# Patient Record
Sex: Male | Born: 1965 | Race: White | Hispanic: No | Marital: Single | Smoking: Current some day smoker
Health system: Southern US, Community
[De-identification: ages and names within clinical notes are randomized; demographics above are authoritative.]

## PROBLEM LIST (undated history)

## (undated) ENCOUNTER — Emergency Department (HOSPITAL_COMMUNITY): Admission: EM | Payer: Medicare Other | Source: Home / Self Care

## (undated) DIAGNOSIS — J449 Chronic obstructive pulmonary disease, unspecified: Secondary | ICD-10-CM

## (undated) DIAGNOSIS — Z720 Tobacco use: Secondary | ICD-10-CM

## (undated) DIAGNOSIS — R739 Hyperglycemia, unspecified: Secondary | ICD-10-CM

## (undated) DIAGNOSIS — E119 Type 2 diabetes mellitus without complications: Secondary | ICD-10-CM

## (undated) DIAGNOSIS — F32A Depression, unspecified: Secondary | ICD-10-CM

## (undated) DIAGNOSIS — F329 Major depressive disorder, single episode, unspecified: Secondary | ICD-10-CM

## (undated) DIAGNOSIS — G4730 Sleep apnea, unspecified: Secondary | ICD-10-CM

## (undated) HISTORY — PX: PILONIDAL CYST EXCISION: SHX744

## (undated) HISTORY — PX: HAND SURGERY: SHX662

---

## 2014-09-12 ENCOUNTER — Emergency Department (HOSPITAL_COMMUNITY): Payer: Medicare Other

## 2014-09-12 ENCOUNTER — Encounter (HOSPITAL_COMMUNITY): Payer: Self-pay | Admitting: Emergency Medicine

## 2014-09-12 ENCOUNTER — Inpatient Hospital Stay (HOSPITAL_COMMUNITY)
Admission: EM | Admit: 2014-09-12 | Discharge: 2014-09-15 | DRG: 287 | Disposition: A | Discharge status: Home / Self Care | Payer: Medicare Other | Attending: Internal Medicine | Admitting: Internal Medicine

## 2014-09-12 DIAGNOSIS — R7989 Other specified abnormal findings of blood chemistry: Secondary | ICD-10-CM | POA: Insufficient documentation

## 2014-09-12 DIAGNOSIS — I951 Orthostatic hypotension: Secondary | ICD-10-CM | POA: Insufficient documentation

## 2014-09-12 DIAGNOSIS — F17200 Nicotine dependence, unspecified, uncomplicated: Secondary | ICD-10-CM | POA: Insufficient documentation

## 2014-09-12 DIAGNOSIS — Y92008 Other place in unspecified non-institutional (private) residence as the place of occurrence of the external cause: Secondary | ICD-10-CM

## 2014-09-12 DIAGNOSIS — W19XXXA Unspecified fall, initial encounter: Secondary | ICD-10-CM

## 2014-09-12 DIAGNOSIS — G4730 Sleep apnea, unspecified: Secondary | ICD-10-CM | POA: Diagnosis present

## 2014-09-12 DIAGNOSIS — R55 Syncope and collapse: Secondary | ICD-10-CM

## 2014-09-12 DIAGNOSIS — J449 Chronic obstructive pulmonary disease, unspecified: Secondary | ICD-10-CM | POA: Diagnosis present

## 2014-09-12 DIAGNOSIS — W1789XA Other fall from one level to another, initial encounter: Secondary | ICD-10-CM | POA: Diagnosis present

## 2014-09-12 DIAGNOSIS — Z888 Allergy status to other drugs, medicaments and biological substances status: Secondary | ICD-10-CM

## 2014-09-12 DIAGNOSIS — M549 Dorsalgia, unspecified: Secondary | ICD-10-CM | POA: Diagnosis present

## 2014-09-12 DIAGNOSIS — I2510 Atherosclerotic heart disease of native coronary artery without angina pectoris: Secondary | ICD-10-CM | POA: Diagnosis present

## 2014-09-12 DIAGNOSIS — E1165 Type 2 diabetes mellitus with hyperglycemia: Secondary | ICD-10-CM | POA: Diagnosis present

## 2014-09-12 DIAGNOSIS — F17210 Nicotine dependence, cigarettes, uncomplicated: Secondary | ICD-10-CM | POA: Diagnosis present

## 2014-09-12 DIAGNOSIS — Z833 Family history of diabetes mellitus: Secondary | ICD-10-CM

## 2014-09-12 DIAGNOSIS — Z886 Allergy status to analgesic agent status: Secondary | ICD-10-CM

## 2014-09-12 DIAGNOSIS — R51 Headache: Secondary | ICD-10-CM | POA: Diagnosis not present

## 2014-09-12 DIAGNOSIS — R9431 Abnormal electrocardiogram [ECG] [EKG]: Secondary | ICD-10-CM | POA: Diagnosis present

## 2014-09-12 HISTORY — DX: Hyperglycemia, unspecified: R73.9

## 2014-09-12 HISTORY — DX: Tobacco use: Z72.0

## 2014-09-12 HISTORY — DX: Sleep apnea, unspecified: G47.30

## 2014-09-12 HISTORY — DX: Depression, unspecified: F32.A

## 2014-09-12 HISTORY — DX: Type 2 diabetes mellitus without complications: E11.9

## 2014-09-12 HISTORY — DX: Chronic obstructive pulmonary disease, unspecified: J44.9

## 2014-09-12 HISTORY — DX: Major depressive disorder, single episode, unspecified: F32.9

## 2014-09-12 LAB — COMPREHENSIVE METABOLIC PANEL
ALBUMIN: 3.7 g/dL (ref 3.5–5.0)
ALT: 20 U/L (ref 17–63)
ANION GAP: 7 (ref 5–15)
AST: 20 U/L (ref 15–41)
Alkaline Phosphatase: 73 U/L (ref 38–126)
BILIRUBIN TOTAL: 0.4 mg/dL (ref 0.3–1.2)
BUN: 16 mg/dL (ref 6–20)
CALCIUM: 9.4 mg/dL (ref 8.9–10.3)
CO2: 25 mmol/L (ref 22–32)
CREATININE: 1.01 mg/dL (ref 0.61–1.24)
Chloride: 106 mmol/L (ref 101–111)
GFR calc Af Amer: >60 mL/min (ref >60)
GFR calc non Af Amer: >60 mL/min (ref >60)
Glucose, Bld: 127 mg/dL — ABNORMAL HIGH (ref 65–99)
POTASSIUM: 4.1 mmol/L (ref 3.5–5.1)
SODIUM: 138 mmol/L (ref 135–145)
Total Protein: 6.9 g/dL (ref 6.5–8.1)

## 2014-09-12 LAB — URINALYSIS, ROUTINE W REFLEX MICROSCOPIC
BILIRUBIN URINE: NEGATIVE
GLUCOSE, UA: NEGATIVE mg/dL
HGB URINE DIPSTICK: NEGATIVE
Ketones, ur: NEGATIVE mg/dL
LEUKOCYTES UA: NEGATIVE
Nitrite: NEGATIVE
PH: 5 (ref 5.0–8.0)
Protein, ur: NEGATIVE mg/dL
Specific Gravity, Urine: 1.014 (ref 1.005–1.030)
UROBILINOGEN UA: 0.2 mg/dL (ref 0.0–1.0)

## 2014-09-12 LAB — RAPID URINE DRUG SCREEN, HOSP PERFORMED
AMPHETAMINES: NOT DETECTED
Barbiturates: NOT DETECTED
Benzodiazepines: NOT DETECTED
Cocaine: NOT DETECTED
Opiates: NOT DETECTED
Tetrahydrocannabinol: POSITIVE — AB

## 2014-09-12 LAB — TROPONIN I

## 2014-09-12 LAB — CBC WITH DIFFERENTIAL/PLATELET
BASOS ABS: 0 10*3/uL (ref 0.0–0.1)
Basophils Relative: 0 % (ref 0–1)
EOS ABS: 0.3 10*3/uL (ref 0.0–0.7)
EOS PCT: 3 % (ref 0–5)
HCT: 45.9 % (ref 39.0–52.0)
Hemoglobin: 16.4 g/dL (ref 13.0–17.0)
Lymphocytes Relative: 16 % (ref 12–46)
Lymphs Abs: 1.7 10*3/uL (ref 0.7–4.0)
MCH: 33.5 pg (ref 26.0–34.0)
MCHC: 35.7 g/dL (ref 30.0–36.0)
MCV: 93.9 fL (ref 78.0–100.0)
MONOS PCT: 7 % (ref 3–12)
Monocytes Absolute: 0.7 10*3/uL (ref 0.1–1.0)
Neutro Abs: 7.5 10*3/uL (ref 1.7–7.7)
Neutrophils Relative %: 74 % (ref 43–77)
Platelets: 191 10*3/uL (ref 150–400)
RBC: 4.89 MIL/uL (ref 4.22–5.81)
RDW: 13.7 % (ref 11.5–15.5)
WBC: 10.2 10*3/uL (ref 4.0–10.5)

## 2014-09-12 LAB — ETHANOL: Alcohol, Ethyl (B): <5 mg/dL (ref <5)

## 2014-09-12 MED ORDER — SODIUM CHLORIDE 0.9 % IJ SOLN
3.0000 mL | Freq: Two times a day (BID) | INTRAMUSCULAR | Status: DC
  Administered 2014-09-13 – 2014-09-14 (×2): 3 mL via INTRAVENOUS

## 2014-09-12 MED ORDER — HYDROCODONE-ACETAMINOPHEN 5-325 MG PO TABS
1.0000 | ORAL_TABLET | Freq: Four times a day (QID) | ORAL | Status: DC | PRN
  Administered 2014-09-12 – 2014-09-15 (×9): 2 via ORAL
  Filled 2014-09-12 (×10): qty 2

## 2014-09-12 MED ORDER — ONDANSETRON HCL 4 MG/2ML IJ SOLN
4.0000 mg | Freq: Once | INTRAMUSCULAR | Status: AC
  Administered 2014-09-12: 4 mg via INTRAVENOUS
  Filled 2014-09-12: qty 2

## 2014-09-12 MED ORDER — HYDROMORPHONE HCL 1 MG/ML IJ SOLN
1.0000 mg | Freq: Once | INTRAMUSCULAR | Status: AC
  Administered 2014-09-12: 1 mg via INTRAVENOUS
  Filled 2014-09-12: qty 1

## 2014-09-12 NOTE — Progress Notes (Signed)
Report taken from [place], RN in ED

## 2014-09-12 NOTE — ED Provider Notes (Signed)
CSN: [id number]     Arrival date & time 09/12/14  1735 History   First MD Initiated Contact with Patient 09/12/14 1740     Chief Complaint  Patient presents with  . Loss of Consciousness  . Fall     (Consider location/radiation/quality/duration/timing/severity/associated sxs/prior Treatment) HPI Comments: Patient brought to the ER for evaluation after syncope and a fall. Patient was sitting on the railing of a porch when he reportedly passed out and fell backwards. He fell approximately 5 feet, hitting his head on a concrete step. He landed on his back. Patient playing of moderate to severe mid back pain. He is brought to the emergency department by EMS immobilized. EMS report that after the initial fall, he did get up and walk about 25 feet, then passed out again. Patient denies chest pain and shortness of breath. He was well earlier today, no warning with the syncopal episode. Patient reports that he did have a syncopal episode last summer causing him to fall and suffered a laceration on his face. He had no explanation for that syncopal episode found.  Patient is a 49 y.o. male presenting with syncope and fall.  Loss of Consciousness Associated symptoms: headaches   Fall Associated symptoms include headaches.    Past Medical History  Diagnosis Date  . Diabetes mellitus without complication   . COPD (chronic obstructive pulmonary disease)   . Sleep apnea    Past Surgical History  Procedure Laterality Date  . Hand surgery Right   . Pilonidal cyst excision     History reviewed. No pertinent family history. History  Substance Use Topics  . Smoking status: Current Every Day Smoker  . Smokeless tobacco: Not on file  . Alcohol Use: No    Review of Systems  Cardiovascular: Positive for syncope.  Musculoskeletal: Positive for back pain.  Neurological: Positive for syncope and headaches.  All other systems reviewed and are negative.     Allergies  Asa; Trazodone and  nefazodone; and Wellbutrin  Home Medications   Prior to Admission medications   Not on File   BP 131/105 mmHg  Pulse 84  Temp(Src) 97.8 F (36.6 C) (Oral)  Resp 17  Ht  (1.778 m)  Wt 225 lb (102.059 kg)  BMI 32.28 kg/m2  SpO2 96% Physical Exam  Constitutional: He is oriented to person, place, and time. He appears well-developed and well-nourished. No distress.  HENT:  Head: Normocephalic and atraumatic.  Right Ear: Hearing normal.  Left Ear: Hearing normal.  Nose: Nose normal.  Mouth/Throat: Oropharynx is clear and moist and mucous membranes are normal.  Eyes: Conjunctivae and EOM are normal. Pupils are equal, round, and reactive to light.  Neck: Normal range of motion. Neck supple.  Cardiovascular: Regular rhythm, S1 normal and S2 normal.  Exam reveals no gallop and no friction rub.   No murmur heard. Pulmonary/Chest: Effort normal and breath sounds normal. No respiratory distress. He exhibits no tenderness.  Abdominal: Soft. Normal appearance and bowel sounds are normal. There is no hepatosplenomegaly. There is no tenderness. There is no rebound, no guarding, no tenderness at McBurney's point and negative Murphy's sign. No hernia.  Musculoskeletal: Normal range of motion.       Lumbar back: He exhibits tenderness.       Back:  Neurological: He is alert and oriented to person, place, and time. He has normal strength. No cranial nerve deficit or sensory deficit. Coordination normal. GCS eye subscore is 4. GCS verbal subscore is 5. GCS  motor subscore is 6.  Skin: Skin is warm, dry and intact. No rash noted. No cyanosis.  Psychiatric: He has a normal mood and affect. His speech is normal and behavior is normal. Thought content normal.  Nursing note and vitals reviewed.   ED Course  Procedures (including critical care time) Labs Review Labs Reviewed  COMPREHENSIVE METABOLIC PANEL - Abnormal; Notable for the following:    Glucose, Bld 127 (*)    All other components  within normal limits  URINE RAPID DRUG SCREEN, HOSP PERFORMED - Abnormal; Notable for the following:    Tetrahydrocannabinol POSITIVE (*)    All other components within normal limits  CBC WITH DIFFERENTIAL/PLATELET  TROPONIN I  URINALYSIS, ROUTINE W REFLEX MICROSCOPIC (NOT AT [facility])  ETHANOL    Imaging Review Dg Chest 1 View  09/12/2014   CLINICAL DATA:  Fall to ground with back pain.  No chest complaints.  EXAM: CHEST  1 VIEW  COMPARISON:  None.  FINDINGS: Normal heart size. Normal upper mediastinal width for supine technique; the aortic knob remains visible.  No acute infiltrate or edema. No effusion or pneumothorax. No acute osseous findings.  IMPRESSION: Negative portable chest.   Electronically Signed   By: Marnee Spring M.D.   On: 09/12/2014 18:31   Dg Thoracic Spine 2 View  09/12/2014   CLINICAL DATA:  49 year old male with a history of fall. Thoracic back pain  EXAM: THORACIC SPINE - 2 VIEW  COMPARISON:  None.  FINDINGS: Thoracic Spine:  Thoracic vertebral elements maintain normal anatomic alignment, with no evidence of anterolisthesis, retrolisthesis, or subluxation.  Irregularity of the T12 vertebral body, with less than 30% anterior height loss. No comparison available.  No significant endplate changes or facet disease.  Unremarkable appearance of the visualized thorax.  IMPRESSION: Irregularity of the T12 vertebral body with less than 30% anterior height loss. Deformity is age-indeterminate, and if there is concern for acute abnormality, recommend correlation with thoracic MRI.  Signed,  Yvone Neu. Loreta Ave, DO  [facility]  [facility]   Electronically Signed   By: Gilmer Mor D.O.   On: 09/12/2014 18:33   Dg Lumbar Spine Complete  09/12/2014   CLINICAL DATA:  Patient status post fall while sitting on railing. Syncopal episode. Initial encounter.  EXAM: LUMBAR SPINE - COMPLETE 4+ VIEW  COMPARISON:  None.  FINDINGS: Normal anatomic  alignment. Age-indeterminate anterior height loss of the T12, L1, L3 and L4 vertebral bodies. Multilevel degenerative disc disease most pronounced L2-3 and L4-5. L4-5 and L5-S1 facet degenerative changes. SI joints are unremarkable. Visualized bowel gas pattern is unremarkable.  IMPRESSION: Age-indeterminate anterior height loss of the T12, L1, L3 and L4 vertebral bodies. Recommend correlation for point tenderness.  Lower lumbar spine degenerative changes.   Electronically Signed   By: Annia Belt M.D.   On: 09/12/2014 18:34   Dg Pelvis 1-2 Views  09/12/2014   CLINICAL DATA:  Pain fell from porch  EXAM: PELVIS - 1-2 VIEW  COMPARISON:  None.  FINDINGS: There is no evidence of pelvic fracture or dislocation. Joint spaces appear intact. No erosive change. Note that there is an assimilation joint on the left with a transitional lumbosacral vertebra.  IMPRESSION: No fracture or dislocation.  No appreciable arthropathy.   Electronically Signed   By: Bretta Bang III M.D.   On: 09/12/2014 18:31   Ct Head Wo Contrast  09/12/2014   CLINICAL DATA:  Status post 5 foot fall today with a blow  to the head. Loss of consciousness and subsequent episode of syncope x2. Right side headache. Initial encounter.  EXAM: CT HEAD WITHOUT CONTRAST  CT CERVICAL SPINE WITHOUT CONTRAST  TECHNIQUE: Multidetector CT imaging of the head and cervical spine was performed following the standard protocol without intravenous contrast. Multiplanar CT image reconstructions of the cervical spine were also generated.  COMPARISON:  None.  FINDINGS: CT HEAD FINDINGS  There is no evidence of acute intracranial abnormality including hemorrhage, infarct, mass lesion, mass effect, midline shift or abnormal extra-axial fluid collection. Circumferential mucosal thickening is seen in the sphenoid and maxillary sinuses bilaterally and scattered ethmoid air cells. There is no fracture.  CT CERVICAL SPINE FINDINGS  Vertebral body height and alignment are  maintained. Intervertebral disc space height is unremarkable. The lung apices are clear.  IMPRESSION: No acute abnormality head or cervical spine.  Sinus disease.   Electronically Signed   By: Drusilla Kanner M.D.   On: 09/12/2014 19:28   Ct Cervical Spine Wo Contrast  09/12/2014   CLINICAL DATA:  Status post 5 foot fall today with a blow to the head. Loss of consciousness and subsequent episode of syncope x2. Right side headache. Initial encounter.  EXAM: CT HEAD WITHOUT CONTRAST  CT CERVICAL SPINE WITHOUT CONTRAST  TECHNIQUE: Multidetector CT imaging of the head and cervical spine was performed following the standard protocol without intravenous contrast. Multiplanar CT image reconstructions of the cervical spine were also generated.  COMPARISON:  None.  FINDINGS: CT HEAD FINDINGS  There is no evidence of acute intracranial abnormality including hemorrhage, infarct, mass lesion, mass effect, midline shift or abnormal extra-axial fluid collection. Circumferential mucosal thickening is seen in the sphenoid and maxillary sinuses bilaterally and scattered ethmoid air cells. There is no fracture.  CT CERVICAL SPINE FINDINGS  Vertebral body height and alignment are maintained. Intervertebral disc space height is unremarkable. The lung apices are clear.  IMPRESSION: No acute abnormality head or cervical spine.  Sinus disease.   Electronically Signed   By: Drusilla Kanner M.D.   On: 09/12/2014 19:28     EKG Interpretation   Date/Time:  Sunday September 12 2014 17:55:08 EDT Ventricular Rate:  82 PR Interval:  147 QRS Duration: 97 QT Interval:  425 QTC Calculation: 496 R Axis:   82 Text Interpretation:  Sinus rhythm Low voltage, extremity leads  Nonspecific T abnrm, anterolateral leads Borderline prolonged QT interval  No previous tracing Confirmed by Blinda Leatherwood  MD, Dotsie Gillette [phone]) on  09/12/2014 6:04:02 PM      MDM   Final diagnoses:  Fall  Fall  Fall   syncope  back pain  Presents to the ER  for evaluation after syncope causing a fall. Patient was sitting on a railing of a porch when he suddenly lost consciousness and fell backwards. Bystanders report that he was completely unconscious and might have had some shaking motion that could be like a seizure. Patient has had 2 other episodes of syncope in the past 2 years. He has never had full workup or at least has never been given a diagnosis for this.  Patient did not have any chest pain, heart palpitations or symptoms prior to syncope. Syncope occurred without warning. Patient apparently got up after the episode and walked a short distance and then passed out again. He does not remember falling.  Patient was complaining of headache and back pain at arrival. He was log rolled off of long spine board and back was examined. He had diffuse lower back  tenderness. There was no obvious defects. Patient sent for CT head and cervical spine as well as thoracic and lumbar x-rays. X-rays show multiple levels of compression abnormality. Patient is tender in the entire lumbar region, cannot tell if this is acute or not. Patient reports that he has been told that he has multiple compression fractures in the past. He is neurologically intact, no sensory or motor deficits at this time.  Patient's cardiac evaluation is unremarkable. All blood work is normal.  As patient has had 2 separate unexplained syncopal episodes today he will require hospitalization for further observation and workup.    Gilda Crease, MD 09/12/14 2207

## 2014-09-12 NOTE — H&P (Addendum)
[facility] History and Physical  Jim Peterson ZOX:[id number] DOB: Jul 19, 1965 DOA: 09/12/2014  Referring physician: EDP PCP: Pcp Not In System   Chief Complaint: Syncope   HPI: Jim Peterson is a 49 y.o. male who suffered 2 syncopal episodes at home today.  First one he fell off of a porch.  Hit head, woke up, has moderate back pain from that.  Got up with EMS, walked 25 feet, then passed out again.  Some report from room-mate that he may have had "shaking" or seizure like activity with the second episode.  History of similar syncopal event last summer.  No chest pain, no SOB, no h/o CHF.    Review of Systems: Systems reviewed.  As above, otherwise negative  Past Medical History  Diagnosis Date  . Diabetes mellitus without complication   . COPD (chronic obstructive pulmonary disease)   . Sleep apnea    Past Surgical History  Procedure Laterality Date  . Hand surgery Right   . Pilonidal cyst excision     Social History:  reports that he has been smoking.  He does not have any smokeless tobacco history on file. He reports that he does not drink alcohol or use illicit drugs.  Allergies  Allergen Reactions  . Asa [Aspirin] Other (See Comments)    Stomach pains  . Trazodone And Nefazodone Other (See Comments)    Weird feeling  . Wellbutrin [Bupropion] Rash    History reviewed. No pertinent family history.   Prior to Admission medications   Not on File   Physical Exam: Filed Vitals:   09/12/14 2200  BP: 112/72  Pulse: 73  Temp:   Resp: 14    BP 112/72 mmHg  Pulse 73  Temp(Src) 97.8 F (36.6 C) (Oral)  Resp 14  Ht  (1.778 m)  Wt 102.059 kg (225 lb)  BMI 32.28 kg/m2  SpO2 95%  General Appearance:    Alert, oriented, no distress, appears stated age  Head:    Normocephalic, atraumatic  Eyes:    PERRL, EOMI, sclera non-icteric        Nose:   Nares without drainage or epistaxis. Mucosa, turbinates normal  Throat:   Moist mucous membranes.  Oropharynx without erythema or exudate.  Neck:   Supple. No carotid bruits.  No thyromegaly.  No lymphadenopathy.   Back:     No CVA tenderness, no spinal tenderness  Lungs:     Clear to auscultation bilaterally, without wheezes, rhonchi or rales  Chest wall:    No tenderness to palpitation  Heart:    Regular rate and rhythm without murmurs, gallops, rubs  Abdomen:     Soft, non-tender, nondistended, normal bowel sounds, no organomegaly  Genitalia:    deferred  Rectal:    deferred  Extremities:   No clubbing, cyanosis or edema.  Pulses:   2+ and symmetric all extremities  Skin:   Skin color, texture, turgor normal, no rashes or lesions  Lymph nodes:   Cervical, supraclavicular, and axillary nodes normal  Neurologic:   CNII-XII intact. Normal strength, sensation and reflexes      throughout    Labs on Admission:  Basic Metabolic Panel:  Recent Labs Lab 09/12/14 1847  NA 138  K 4.1  CL 106  CO2 25  GLUCOSE 127*  BUN 16  CREATININE 1.01  CALCIUM 9.4   Liver Function Tests:  Recent Labs Lab 09/12/14 1847  AST 20  ALT 20  ALKPHOS 73  BILITOT 0.4  PROT 6.9  ALBUMIN 3.7   No results for input(s): LIPASE, AMYLASE in the last 168 hours. No results for input(s): AMMONIA in the last 168 hours. CBC:  Recent Labs Lab 09/12/14 1847  WBC 10.2  NEUTROABS 7.5  HGB 16.4  HCT 45.9  MCV 93.9  PLT 191   Cardiac Enzymes:  Recent Labs Lab 09/12/14 1847  TROPONINI <0.03    BNP (last 3 results) No results for input(s): PROBNP in the last 8760 hours. CBG: No results for input(s): GLUCAP in the last 168 hours.  Radiological Exams on Admission: Dg Chest 1 View  09/12/2014   CLINICAL DATA:  Fall to ground with back pain.  No chest complaints.  EXAM: CHEST  1 VIEW  COMPARISON:  None.  FINDINGS: Normal heart size. Normal upper mediastinal width for supine technique; the aortic knob remains visible.  No acute infiltrate or edema. No effusion or pneumothorax. No acute osseous  findings.  IMPRESSION: Negative portable chest.   Electronically Signed   By: Marnee Spring M.D.   On: 09/12/2014 18:31   Dg Thoracic Spine 2 View  09/12/2014   CLINICAL DATA:  49 year old male with a history of fall. Thoracic back pain  EXAM: THORACIC SPINE - 2 VIEW  COMPARISON:  None.  FINDINGS: Thoracic Spine:  Thoracic vertebral elements maintain normal anatomic alignment, with no evidence of anterolisthesis, retrolisthesis, or subluxation.  Irregularity of the T12 vertebral body, with less than 30% anterior height loss. No comparison available.  No significant endplate changes or facet disease.  Unremarkable appearance of the visualized thorax.  IMPRESSION: Irregularity of the T12 vertebral body with less than 30% anterior height loss. Deformity is age-indeterminate, and if there is concern for acute abnormality, recommend correlation with thoracic MRI.  Signed,  Yvone Neu. Loreta Ave, DO  [facility]  [facility]   Electronically Signed   By: Gilmer Mor D.O.   On: 09/12/2014 18:33   Dg Lumbar Spine Complete  09/12/2014   CLINICAL DATA:  Patient status post fall while sitting on railing. Syncopal episode. Initial encounter.  EXAM: LUMBAR SPINE - COMPLETE 4+ VIEW  COMPARISON:  None.  FINDINGS: Normal anatomic alignment. Age-indeterminate anterior height loss of the T12, L1, L3 and L4 vertebral bodies. Multilevel degenerative disc disease most pronounced L2-3 and L4-5. L4-5 and L5-S1 facet degenerative changes. SI joints are unremarkable. Visualized bowel gas pattern is unremarkable.  IMPRESSION: Age-indeterminate anterior height loss of the T12, L1, L3 and L4 vertebral bodies. Recommend correlation for point tenderness.  Lower lumbar spine degenerative changes.   Electronically Signed   By: Annia Belt M.D.   On: 09/12/2014 18:34   Dg Pelvis 1-2 Views  09/12/2014   CLINICAL DATA:  Pain fell from porch  EXAM: PELVIS - 1-2 VIEW  COMPARISON:  None.  FINDINGS:  There is no evidence of pelvic fracture or dislocation. Joint spaces appear intact. No erosive change. Note that there is an assimilation joint on the left with a transitional lumbosacral vertebra.  IMPRESSION: No fracture or dislocation.  No appreciable arthropathy.   Electronically Signed   By: Bretta Bang III M.D.   On: 09/12/2014 18:31   Ct Head Wo Contrast  09/12/2014   CLINICAL DATA:  Status post 5 foot fall today with a blow to the head. Loss of consciousness and subsequent episode of syncope x2. Right side headache. Initial encounter.  EXAM: CT HEAD WITHOUT CONTRAST  CT CERVICAL SPINE WITHOUT CONTRAST  TECHNIQUE: Multidetector CT imaging of the head and  cervical spine was performed following the standard protocol without intravenous contrast. Multiplanar CT image reconstructions of the cervical spine were also generated.  COMPARISON:  None.  FINDINGS: CT HEAD FINDINGS  There is no evidence of acute intracranial abnormality including hemorrhage, infarct, mass lesion, mass effect, midline shift or abnormal extra-axial fluid collection. Circumferential mucosal thickening is seen in the sphenoid and maxillary sinuses bilaterally and scattered ethmoid air cells. There is no fracture.  CT CERVICAL SPINE FINDINGS  Vertebral body height and alignment are maintained. Intervertebral disc space height is unremarkable. The lung apices are clear.  IMPRESSION: No acute abnormality head or cervical spine.  Sinus disease.   Electronically Signed   By: Drusilla Kanner M.D.   On: 09/12/2014 19:28   Ct Cervical Spine Wo Contrast  09/12/2014   CLINICAL DATA:  Status post 5 foot fall today with a blow to the head. Loss of consciousness and subsequent episode of syncope x2. Right side headache. Initial encounter.  EXAM: CT HEAD WITHOUT CONTRAST  CT CERVICAL SPINE WITHOUT CONTRAST  TECHNIQUE: Multidetector CT imaging of the head and cervical spine was performed following the standard protocol without intravenous  contrast. Multiplanar CT image reconstructions of the cervical spine were also generated.  COMPARISON:  None.  FINDINGS: CT HEAD FINDINGS  There is no evidence of acute intracranial abnormality including hemorrhage, infarct, mass lesion, mass effect, midline shift or abnormal extra-axial fluid collection. Circumferential mucosal thickening is seen in the sphenoid and maxillary sinuses bilaterally and scattered ethmoid air cells. There is no fracture.  CT CERVICAL SPINE FINDINGS  Vertebral body height and alignment are maintained. Intervertebral disc space height is unremarkable. The lung apices are clear.  IMPRESSION: No acute abnormality head or cervical spine.  Sinus disease.   Electronically Signed   By: Drusilla Kanner M.D.   On: 09/12/2014 19:28    EKG: Independently reviewed.  Assessment/Plan Active Problems:   Syncope   1. Syncope - 1. Syncope protocol 2. Tele monitor 3. Echo 4. Orthostatic vitals 5. EEG given the report of "shaking" by room mate   Code Status: Full  Family Communication: No family in room Disposition Plan: Admit to obs   Time spent: 30 min  Boyce Keltner M. [facility] Pager [phone]  If 7AM-7PM, please contact the day team taking care of the patient [email or web address] Password [facility] 09/12/2014, 10:24 PM

## 2014-09-12 NOTE — ED Notes (Signed)
Sitting on railing of porch. Larey Seat about 5 feet down hitting head on concrete step. Reports pain to right side of head. Got up walked about 20 feet and had another syncopal episode falling in grass. Reports pain in midback; able to move all extremities. Immobilized during transport. C-Collar on. Cleared from board by Dr. Blinda Leatherwood. Alert and oriented x4 on arrival. States he remembers sitting on porch then waking up on ground and then walking and waking up in the grass. Occurred one addition time last summer.

## 2014-09-12 NOTE — Progress Notes (Signed)
Patient admitted to unit via stretcher to [phone]. Patient alert and oriented x 4. IV intact. Skin intact. In no distress at this time. Call bell within reach, patient oriented to unit and room. Patient placed on tele box #22, NSR. Awaiting MD orders.

## 2014-09-12 NOTE — Progress Notes (Signed)
Dr. Julian Reil paged, patient requesting something for pain. No current orders in place. Dr. Julian Reil given telephone readback order for norco 5-325mg  1-2 tablets Q6h prn  PO Q6h for moderate, severe pain.

## 2014-09-13 ENCOUNTER — Observation Stay (HOSPITAL_COMMUNITY): Payer: Medicare Other

## 2014-09-13 ENCOUNTER — Encounter (HOSPITAL_COMMUNITY): Payer: Self-pay | Admitting: Cardiology

## 2014-09-13 DIAGNOSIS — R55 Syncope and collapse: Secondary | ICD-10-CM | POA: Insufficient documentation

## 2014-09-13 DIAGNOSIS — R791 Abnormal coagulation profile: Secondary | ICD-10-CM

## 2014-09-13 DIAGNOSIS — Z720 Tobacco use: Secondary | ICD-10-CM

## 2014-09-13 DIAGNOSIS — F17200 Nicotine dependence, unspecified, uncomplicated: Secondary | ICD-10-CM | POA: Insufficient documentation

## 2014-09-13 DIAGNOSIS — I951 Orthostatic hypotension: Secondary | ICD-10-CM | POA: Diagnosis not present

## 2014-09-13 DIAGNOSIS — I6789 Other cerebrovascular disease: Secondary | ICD-10-CM

## 2014-09-13 DIAGNOSIS — R7989 Other specified abnormal findings of blood chemistry: Secondary | ICD-10-CM | POA: Insufficient documentation

## 2014-09-13 LAB — D-DIMER, QUANTITATIVE (NOT AT ARMC): D DIMER QUANT: 1.16 ug{FEU}/mL — AB (ref 0.00–0.48)

## 2014-09-13 LAB — TROPONIN I: Troponin I: <0.03 ng/mL (ref <0.031)

## 2014-09-13 LAB — GLUCOSE, CAPILLARY: Glucose-Capillary: 101 mg/dL — ABNORMAL HIGH (ref 65–99)

## 2014-09-13 MED ORDER — IOHEXOL 350 MG/ML SOLN
80.0000 mL | Freq: Once | INTRAVENOUS | Status: AC | PRN
  Administered 2014-09-13: 80 mL via INTRAVENOUS

## 2014-09-13 MED ORDER — ENOXAPARIN SODIUM 40 MG/0.4ML ~~LOC~~ SOLN
40.0000 mg | SUBCUTANEOUS | Status: DC
  Filled 2014-09-13 (×3): qty 0.4

## 2014-09-13 MED ORDER — ASPIRIN 81 MG PO CHEW
81.0000 mg | CHEWABLE_TABLET | Freq: Every day | ORAL | Status: DC
  Administered 2014-09-13 – 2014-09-15 (×2): 81 mg via ORAL
  Filled 2014-09-13 (×3): qty 1

## 2014-09-13 MED ORDER — SODIUM CHLORIDE 0.9 % IV SOLN
INTRAVENOUS | Status: DC
  Administered 2014-09-13 – 2014-09-14 (×3): via INTRAVENOUS

## 2014-09-13 MED ORDER — SODIUM CHLORIDE 0.9 % IV BOLUS (SEPSIS)
1000.0000 mL | Freq: Once | INTRAVENOUS | Status: AC
  Administered 2014-09-13: 1000 mL via INTRAVENOUS

## 2014-09-13 NOTE — Progress Notes (Signed)
Pt refuses cardiac cath at this time, does not want to sign consent form

## 2014-09-13 NOTE — Progress Notes (Signed)
[facility] Progress Note   Jim Peterson NZV:[id number] DOB: 1965-10-08 DOA: 09/12/2014 PCP: Pcp Not In System  Brief narrative: Jim Peterson is a 49 y.o. male with history of smoking who was previously a diabetic with resolution once he lost weight. The patient presents with syncope. He initially fell off the railing of the porch and hit his head on the stairs. He then got up and again to walk and fell and became unresponsive once again EMS brought him into the hospital. He had a similar episode last summer. He does not recall having any chest pain focal numbness weakness tingling or change in vision prior to or after the episode.   Subjective: No complaints at this time.  Assessment/Plan: Active Problems:   Syncope -Possibly cardiogenic -Dr. Jacinto Halim will perform cardiac cath tomorrow-patient will have an event monitor placed -Dimer elevated-check CT of the chest for PE - EEG negative for seizure predisposition head CT negative  Orthostatic hypotension -May have contributed to above-has been given a liter of normal saline-Will continue fluids and continue to follow orthostatic vitals  Cigarette Smoker -As been advised to discontinue   Code Status: Full Code Family Communication:  DVT prophylaxis: Lovenox Consultants: Cardiology Procedures:  Antibiotics: Anti-infectives    None      Objective: Filed Weights   09/12/14 1751 09/12/14 2310 09/13/14 0518  Weight: 102.059 kg (225 lb) 98.793 kg (217 lb 12.8 oz) 99 kg (218 lb 4.1 oz)    Intake/Output Summary (Last 24 hours) at 09/13/14 1655 Last data filed at 09/13/14 1510  Gross per 24 hour  Intake    340 ml  Output   1525 ml  Net  -1185 ml     Vitals Filed Vitals:   09/13/14 1218 09/13/14 1516 09/13/14 1518 09/13/14 1521  BP: 93/63 139/78 127/88 129/78  Pulse: 64 77 77 73  Temp: 98.2 F (36.8 C) 97.8 F (36.6 C) 97.5 F (36.4 C) 97.5 F (36.4 C)  TempSrc: Oral Oral Oral   Resp: 18 16 18 18    Height:      Weight:      SpO2: 98% 100% 100% 100%    Exam:  General:  Pt is alert, not in acute distress  HEENT: No icterus, No thrush, oral mucosa moist  Cardiovascular: regular rate and rhythm, S1/S2 No murmur  Respiratory: clear to auscultation bilaterally   Abdomen: Soft, +Bowel sounds, non tender, non distended, no guarding  MSK: No LE edema, cyanosis or clubbing  Data Reviewed: Basic Metabolic Panel:  Recent Labs Lab 09/12/14 1847  NA 138  K 4.1  CL 106  CO2 25  GLUCOSE 127*  BUN 16  CREATININE 1.01  CALCIUM 9.4   Liver Function Tests:  Recent Labs Lab 09/12/14 1847  AST 20  ALT 20  ALKPHOS 73  BILITOT 0.4  PROT 6.9  ALBUMIN 3.7   No results for input(s): LIPASE, AMYLASE in the last 168 hours. No results for input(s): AMMONIA in the last 168 hours. CBC:  Recent Labs Lab 09/12/14 1847  WBC 10.2  NEUTROABS 7.5  HGB 16.4  HCT 45.9  MCV 93.9  PLT 191   Cardiac Enzymes:  Recent Labs Lab 09/12/14 1847  TROPONINI <0.03   BNP (last 3 results) No results for input(s): BNP in the last 8760 hours.  ProBNP (last 3 results) No results for input(s): PROBNP in the last 8760 hours.  CBG:  Recent Labs Lab 09/13/14 0808  GLUCAP 101*    No results found for this  or any previous visit (from the past 240 hour(s)).   Studies: Dg Chest 1 View  09/12/2014   CLINICAL DATA:  Fall to ground with back pain.  No chest complaints.  EXAM: CHEST  1 VIEW  COMPARISON:  None.  FINDINGS: Normal heart size. Normal upper mediastinal width for supine technique; the aortic knob remains visible.  No acute infiltrate or edema. No effusion or pneumothorax. No acute osseous findings.  IMPRESSION: Negative portable chest.   Electronically Signed   By: Marnee Spring M.D.   On: 09/12/2014 18:31   Dg Thoracic Spine 2 View  09/12/2014   CLINICAL DATA:  49 year old male with a history of fall. Thoracic back pain  EXAM: THORACIC SPINE - 2 VIEW  COMPARISON:  None.   FINDINGS: Thoracic Spine:  Thoracic vertebral elements maintain normal anatomic alignment, with no evidence of anterolisthesis, retrolisthesis, or subluxation.  Irregularity of the T12 vertebral body, with less than 30% anterior height loss. No comparison available.  No significant endplate changes or facet disease.  Unremarkable appearance of the visualized thorax.  IMPRESSION: Irregularity of the T12 vertebral body with less than 30% anterior height loss. Deformity is age-indeterminate, and if there is concern for acute abnormality, recommend correlation with thoracic MRI.  Signed,  Yvone Neu. Loreta Ave, DO  [facility]  [facility]   Electronically Signed   By: Gilmer Mor D.O.   On: 09/12/2014 18:33   Dg Lumbar Spine Complete  09/12/2014   CLINICAL DATA:  Patient status post fall while sitting on railing. Syncopal episode. Initial encounter.  EXAM: LUMBAR SPINE - COMPLETE 4+ VIEW  COMPARISON:  None.  FINDINGS: Normal anatomic alignment. Age-indeterminate anterior height loss of the T12, L1, L3 and L4 vertebral bodies. Multilevel degenerative disc disease most pronounced L2-3 and L4-5. L4-5 and L5-S1 facet degenerative changes. SI joints are unremarkable. Visualized bowel gas pattern is unremarkable.  IMPRESSION: Age-indeterminate anterior height loss of the T12, L1, L3 and L4 vertebral bodies. Recommend correlation for point tenderness.  Lower lumbar spine degenerative changes.   Electronically Signed   By: Annia Belt M.D.   On: 09/12/2014 18:34   Dg Pelvis 1-2 Views  09/12/2014   CLINICAL DATA:  Pain fell from porch  EXAM: PELVIS - 1-2 VIEW  COMPARISON:  None.  FINDINGS: There is no evidence of pelvic fracture or dislocation. Joint spaces appear intact. No erosive change. Note that there is an assimilation joint on the left with a transitional lumbosacral vertebra.  IMPRESSION: No fracture or dislocation.  No appreciable arthropathy.   Electronically Signed    By: Bretta Bang III M.D.   On: 09/12/2014 18:31   Ct Head Wo Contrast  09/12/2014   CLINICAL DATA:  Status post 5 foot fall today with a blow to the head. Loss of consciousness and subsequent episode of syncope x2. Right side headache. Initial encounter.  EXAM: CT HEAD WITHOUT CONTRAST  CT CERVICAL SPINE WITHOUT CONTRAST  TECHNIQUE: Multidetector CT imaging of the head and cervical spine was performed following the standard protocol without intravenous contrast. Multiplanar CT image reconstructions of the cervical spine were also generated.  COMPARISON:  None.  FINDINGS: CT HEAD FINDINGS  There is no evidence of acute intracranial abnormality including hemorrhage, infarct, mass lesion, mass effect, midline shift or abnormal extra-axial fluid collection. Circumferential mucosal thickening is seen in the sphenoid and maxillary sinuses bilaterally and scattered ethmoid air cells. There is no fracture.  CT CERVICAL SPINE FINDINGS  Vertebral body height  and alignment are maintained. Intervertebral disc space height is unremarkable. The lung apices are clear.  IMPRESSION: No acute abnormality head or cervical spine.  Sinus disease.   Electronically Signed   By: Drusilla Kanner M.D.   On: 09/12/2014 19:28   Ct Cervical Spine Wo Contrast  09/12/2014   CLINICAL DATA:  Status post 5 foot fall today with a blow to the head. Loss of consciousness and subsequent episode of syncope x2. Right side headache. Initial encounter.  EXAM: CT HEAD WITHOUT CONTRAST  CT CERVICAL SPINE WITHOUT CONTRAST  TECHNIQUE: Multidetector CT imaging of the head and cervical spine was performed following the standard protocol without intravenous contrast. Multiplanar CT image reconstructions of the cervical spine were also generated.  COMPARISON:  None.  FINDINGS: CT HEAD FINDINGS  There is no evidence of acute intracranial abnormality including hemorrhage, infarct, mass lesion, mass effect, midline shift or abnormal extra-axial fluid  collection. Circumferential mucosal thickening is seen in the sphenoid and maxillary sinuses bilaterally and scattered ethmoid air cells. There is no fracture.  CT CERVICAL SPINE FINDINGS  Vertebral body height and alignment are maintained. Intervertebral disc space height is unremarkable. The lung apices are clear.  IMPRESSION: No acute abnormality head or cervical spine.  Sinus disease.   Electronically Signed   By: Drusilla Kanner M.D.   On: 09/12/2014 19:28    Scheduled Meds:  Scheduled Meds: . aspirin  81 mg Oral Daily  . enoxaparin (LOVENOX) injection  40 mg Subcutaneous Q24H  . sodium chloride  3 mL Intravenous Q12H   Continuous Infusions: . sodium chloride 125 mL/hr at 09/13/14 0734    Time spent on care of this patient: 35 min   Mekayla Soman, MD 09/13/2014, 4:55 PM    [facility] Office  [phone] Pager - Text Page per [email or web address] If 7PM-7AM, please contact night-coverage [email or web address]

## 2014-09-13 NOTE — Consult Note (Signed)
CARDIOLOGY CONSULT NOTE  Patient ID: Jim Peterson MRN: [id number] DOB/AGE: 12/06/1965 48 y.o.  Admit date: 09/12/2014 Referring Physician  Saima Rizwan,MD Primary Physician:  Pcp Not In System Reason for Consultation  Syncope  HPI: Jim Peterson  is a 48 y.o. male  With history of diabetes mellitus, patient has lost about 60 pounds in weight over the past 1 year by making dietary changes and avoiding carbohydrate rich diet, history of heavy tobacco use disorder, no smoking about 2-3 cigarettes a day, presently disabled from a work-related injury remotely, admitted to the hospital with sudden onset of syncope 3. Patient states that he stopped all his medications about a year ago.  First episode of syncope occurred while he was talking to his [place] neighbor sitting on the railing, patient without any warning symptoms fell forward. No injuries. Patient got up felt well and sat up on the railing again and then fell backwards. He hurt his back, then sat back on the railing but felt pain in the back, hence started walking towards her room, when he suddenly fell to the floor again. EMS was activated and he was brought to the emergency room. She has not had any recurrence since being admitted to the hospital.  Denies any premonitory symptoms, denies any chest pain. He states that he used to have shortness of breath with exertional activities in the past but having lost weight and quit smoking heavily, states that he is able to do all his chores without any limitations. He does complain of left hip discomfort with activity and feels like it is cramping in his left buttock area. He denies any symptoms of neurologic deficits, no bowel or bladder disturbances, no convulsions, did not have any incontinence with the episode.  Patient states that he has had similar episode about a year ago.  Past Medical History  Diagnosis Date  . Diabetes mellitus without complication   . COPD (chronic obstructive  pulmonary disease)   . Sleep apnea   . Depression   . Tobacco abuse      Past Surgical History  Procedure Laterality Date  . Hand surgery Right   . Pilonidal cyst excision       History reviewed.  Family history: Positive for diabetes mellitus, no history of Provigil coronary artery disease.  Social History: History   Social History  . Marital Status: Single    Spouse Name: N/A  . Number of Children: N/A  . Years of Education: N/A   Occupational History  . Not on file.   Social History Main Topics  . Smoking status: Current Some Day Smoker -- 0.00 packs/day for 41 years  . Smokeless tobacco: Not on file  . Alcohol Use: No  . Drug Use: Yes    Special: Marijuana  . Sexual Activity: Not on file   Other Topics Concern  . Not on file   Social History Narrative  Patient smokes about 2-3 cigarettes a day. Does not drink alcohol, states that he was a heavy alcohol drinker about 20-30 years ago, has had GI bleed and since then has quit. He has had half a glass of beer about a couple months ago. Used to smoke marijuana in the past, no use of other illicit drugs.    ROS: General: no fevers/chills/night sweats Eyes: no blurry vision, diplopia, or amaurosis ENT: no sore throat or hearing loss Resp: no cough, wheezing, or hemoptysis CV: no edema or palpitations GI: no abdominal pain, nausea, vomiting, diarrhea, or constipation GU:   no dysuria, frequency, or hematuria Skin: no rash Neuro: no headache, numbness, tingling, or weakness of extremities Musculoskeletal: complaints of back pain since the fall and syncope. 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 Endo: no polydipsia or polyuria    Physical Exam: Blood pressure 129/78, pulse 73, temperature 97.5 F (36.4 C), temperature source Oral, resp. rate 18, height 5' 10" (1.778 m), weight 99 kg (218 lb 4.1 oz), SpO2 100 %.  Body mass index is 31.32 kg/(m^2).   General appearance: alert, cooperative,  appears stated age, no distress and mildly obese Lungs: clear to auscultation bilaterally Chest wall: no tenderness Heart: regular rate and rhythm, S1, S2 normal, no murmur, click, rub or gallop Abdomen: soft, non-tender; bowel sounds normal; no masses,  no organomegaly Extremities: extremities normal, atraumatic, no cyanosis or edema Pulses: Carotids 2+ without any bruit, left femoral pulse 1-2+, right femoral pulse to 3+. Popliteal pulse 2+ bilaterally, pedal pulses faint bilaterally. Capillary refill normal. Neurologic: Grossly normal  Labs:   Lab Results  Component Value Date   WBC 10.2 09/12/2014   HGB 16.4 09/12/2014   HCT 45.9 09/12/2014   MCV 93.9 09/12/2014   PLT 191 09/12/2014    Recent Labs Lab 09/12/14 1847  NA 138  K 4.1  CL 106  CO2 25  BUN 16  CREATININE 1.01  CALCIUM 9.4  PROT 6.9  BILITOT 0.4  ALKPHOS 73  ALT 20  AST 20  GLUCOSE 127*   Cardiac Panel (last 3 results)  Recent Labs  09/12/14 1847  TROPONINI <0.03    Lab Results  Component Value Date   TROPONINI <0.03 09/12/2014     EKG: 09/12/2014: Normal sinus rhythm, normal axis. Nonspecific T inversions in the inferior leads, T wave inversion in anterolateral leads, borderline QT prolongation, cannot exclude anterolateral ischemia.  Echo:  09/13/2014: Normal LV systolic function, ejection fraction 55-60% without regional wall motion of inability. Mild biatrial enlargement.    Radiology: Chest x-ray 09/12/2014: No acute abnormality.   Scheduled Meds: . enoxaparin (LOVENOX) injection  40 mg Subcutaneous Q24H  . sodium chloride  3 mL Intravenous Q12H   Continuous Infusions: . sodium chloride 125 mL/hr at 09/13/14 0734   PRN Meds:.HYDROcodone-acetaminophen  ASSESSMENT AND PLAN:  1. Syncope, cannot exclude Cardiogenic syncope. No premonitory symptoms.  2. Abnormal EKG, cannot exclude anterolateral ischemia. 3. History of tobacco use disorder 4. Hyperglycemia  Recommendation: Patient  presentation is concerning for cardiac etiology for syncope. Would recommend coronary angiography due to his underlying cardiovascular status to exclude ischemic etiology, if coronary artery disease is ruled out, he will need event monitoring for 30 days. No changes in the medications were done today. I'll obtain lipid profile along with TSH and HbA1c. We have already discussed and planned to obtaining d-dimer, if abnormal patient will need CT scan of the chest to exclude pulmonary embolism. I'm also repeating the serum troponin. Continue to make update.   Discussed risks, benefits and alternatives of angiogram including but not limited to <1% risk of death, stroke, MI, need for urgent surgical revascularization, renal failure, but not limited to thest. patient is willing to proceed.  Jannie Doyle, MD 09/13/2014, 4:02 PM [facility] Pager: [phone] Office: [phone] If no answer Cell [phone]  

## 2014-09-13 NOTE — Procedures (Signed)
ELECTROENCEPHALOGRAM REPORT   Patient: Jim Peterson       Room #: 6H60 EEG No. ID: [id number] Age: 49 y.o.        Sex: male Referring Physician: Butler Denmark Report Date:  09/13/2014        Interpreting Physician: Thana Farr  History: [name] is an 49 y.o. male with syncope  Medications:  Scheduled: . enoxaparin (LOVENOX) injection  40 mg Subcutaneous Q24H  . sodium chloride  1,000 mL Intravenous Once  . sodium chloride  3 mL Intravenous Q12H    Conditions of Recording:  This is a 16 channel EEG carried out with the patient in the awake and drowsy states.  Description:  The waking background activity consists of a low voltage, symmetrical, fairly well organized, 10 Hz alpha activity, seen from the parieto-occipital and posterior temporal regions.  Low voltage fast activity, poorly organized, is seen anteriorly and is at times superimposed on more posterior regions.  A mixture of theta and alpha rhythms are seen from the central and temporal regions. There are short periods during the recording when the background activity slows and consists of a poorly organized, diffusely distributed theta rhytm with a maximum frequency of 6Hz .  This generalized slowing alternates with 1-2 second bursts of high voltage polymorphic delta activity.  Some occasional vertex central sharp transients are noted as well.  These periods likely represent drowse.       Hyperventilation and intermittent photic stimulation were not performed.   IMPRESSION: This is a normal awake and drowsy electroencephalogram.  No epileptiform activity is noted.     Thana Farr, MD [facility] [phone] 09/13/2014, 11:44 AM

## 2014-09-13 NOTE — Progress Notes (Signed)
EEG Completed; Results Pending  

## 2014-09-13 NOTE — Progress Notes (Signed)
  Echocardiogram 2D Echocardiogram has been performed.  Arvil Chaco 09/13/2014, 1:37 PM

## 2014-09-14 ENCOUNTER — Encounter (HOSPITAL_COMMUNITY)
Admission: EM | Disposition: A | Discharge status: Home / Self Care | Payer: Medicare Other | Source: Home / Self Care | Attending: Internal Medicine

## 2014-09-14 ENCOUNTER — Encounter (HOSPITAL_COMMUNITY): Payer: Self-pay | Admitting: Cardiology

## 2014-09-14 DIAGNOSIS — F17210 Nicotine dependence, cigarettes, uncomplicated: Secondary | ICD-10-CM | POA: Diagnosis present

## 2014-09-14 DIAGNOSIS — Z886 Allergy status to analgesic agent status: Secondary | ICD-10-CM | POA: Diagnosis not present

## 2014-09-14 DIAGNOSIS — Y92008 Other place in unspecified non-institutional (private) residence as the place of occurrence of the external cause: Secondary | ICD-10-CM | POA: Diagnosis not present

## 2014-09-14 DIAGNOSIS — R55 Syncope and collapse: Secondary | ICD-10-CM | POA: Diagnosis not present

## 2014-09-14 DIAGNOSIS — W1789XA Other fall from one level to another, initial encounter: Secondary | ICD-10-CM | POA: Diagnosis present

## 2014-09-14 DIAGNOSIS — Z888 Allergy status to other drugs, medicaments and biological substances status: Secondary | ICD-10-CM | POA: Diagnosis not present

## 2014-09-14 DIAGNOSIS — I951 Orthostatic hypotension: Secondary | ICD-10-CM | POA: Diagnosis present

## 2014-09-14 DIAGNOSIS — R51 Headache: Secondary | ICD-10-CM | POA: Diagnosis present

## 2014-09-14 DIAGNOSIS — M549 Dorsalgia, unspecified: Secondary | ICD-10-CM | POA: Diagnosis present

## 2014-09-14 DIAGNOSIS — I2510 Atherosclerotic heart disease of native coronary artery without angina pectoris: Secondary | ICD-10-CM | POA: Diagnosis present

## 2014-09-14 DIAGNOSIS — E1165 Type 2 diabetes mellitus with hyperglycemia: Secondary | ICD-10-CM | POA: Diagnosis present

## 2014-09-14 DIAGNOSIS — G4730 Sleep apnea, unspecified: Secondary | ICD-10-CM | POA: Diagnosis present

## 2014-09-14 DIAGNOSIS — J449 Chronic obstructive pulmonary disease, unspecified: Secondary | ICD-10-CM | POA: Diagnosis present

## 2014-09-14 DIAGNOSIS — R791 Abnormal coagulation profile: Secondary | ICD-10-CM | POA: Diagnosis not present

## 2014-09-14 DIAGNOSIS — R9431 Abnormal electrocardiogram [ECG] [EKG]: Secondary | ICD-10-CM | POA: Diagnosis present

## 2014-09-14 DIAGNOSIS — Z833 Family history of diabetes mellitus: Secondary | ICD-10-CM | POA: Diagnosis not present

## 2014-09-14 HISTORY — PX: CARDIAC CATHETERIZATION: SHX172

## 2014-09-14 LAB — GLUCOSE, CAPILLARY
GLUCOSE-CAPILLARY: 84 mg/dL (ref 65–99)
GLUCOSE-CAPILLARY: 179 mg/dL — AB (ref 65–99)
Glucose-Capillary: 92 mg/dL (ref 65–99)
Glucose-Capillary: 95 mg/dL (ref 65–99)

## 2014-09-14 LAB — SURGICAL PCR SCREEN
MRSA, PCR: POSITIVE — AB
Staphylococcus aureus: POSITIVE — AB

## 2014-09-14 LAB — LIPID PANEL
CHOL/HDL RATIO: 4.2 ratio
Cholesterol: 151 mg/dL (ref 0–200)
HDL: 36 mg/dL — AB (ref >40)
LDL CALC: 102 mg/dL — AB (ref 0–99)
Triglycerides: 63 mg/dL (ref <150)
VLDL: 13 mg/dL (ref 0–40)

## 2014-09-14 LAB — TSH: TSH: 7.917 u[IU]/mL — ABNORMAL HIGH (ref 0.350–4.500)

## 2014-09-14 LAB — PROTIME-INR
INR: 0.99 (ref 0.00–1.49)
Prothrombin Time: 13.3 seconds (ref 11.6–15.2)

## 2014-09-14 SURGERY — LEFT HEART CATH AND CORONARY ANGIOGRAPHY

## 2014-09-14 MED ORDER — VERAPAMIL HCL 2.5 MG/ML IV SOLN
INTRAVENOUS | Status: DC | PRN
  Administered 2014-09-14: 13:00:00 via INTRA_ARTERIAL

## 2014-09-14 MED ORDER — SODIUM CHLORIDE 0.9 % IJ SOLN: 3.0000 mL | Freq: Two times a day (BID) | INTRAMUSCULAR | Status: DC

## 2014-09-14 MED ORDER — SODIUM CHLORIDE 0.9 % IV SOLN: 250.0000 mL | INTRAVENOUS | Status: DC | PRN

## 2014-09-14 MED ORDER — NITROGLYCERIN 1 MG/10 ML FOR IR/CATH LAB
INTRA_ARTERIAL | Status: AC
  Filled 2014-09-14: qty 10

## 2014-09-14 MED ORDER — HEPARIN SODIUM (PORCINE) 1000 UNIT/ML IJ SOLN
INTRAMUSCULAR | Status: DC | PRN
  Administered 2014-09-14: 6000 [IU] via INTRAVENOUS

## 2014-09-14 MED ORDER — MIDAZOLAM HCL 2 MG/2ML IJ SOLN
INTRAMUSCULAR | Status: DC | PRN
  Administered 2014-09-14: 1 mg via INTRAVENOUS

## 2014-09-14 MED ORDER — VERAPAMIL HCL 2.5 MG/ML IV SOLN
INTRAVENOUS | Status: AC
  Filled 2014-09-14: qty 2

## 2014-09-14 MED ORDER — LIDOCAINE HCL (PF) 1 % IJ SOLN
INTRAMUSCULAR | Status: AC
  Filled 2014-09-14: qty 30

## 2014-09-14 MED ORDER — ACETAMINOPHEN 325 MG PO TABS: 650.0000 mg | ORAL_TABLET | ORAL | Status: DC | PRN

## 2014-09-14 MED ORDER — HEPARIN (PORCINE) IN NACL 2-0.9 UNIT/ML-% IJ SOLN
INTRAMUSCULAR | Status: AC
  Filled 2014-09-14: qty 1500

## 2014-09-14 MED ORDER — HYDROMORPHONE HCL 1 MG/ML IJ SOLN
INTRAMUSCULAR | Status: DC | PRN
  Administered 2014-09-14 (×2): 0.5 mg via INTRAVENOUS

## 2014-09-14 MED ORDER — HYDROMORPHONE HCL 1 MG/ML IJ SOLN
INTRAMUSCULAR | Status: AC
  Filled 2014-09-14: qty 1

## 2014-09-14 MED ORDER — CHLORHEXIDINE GLUCONATE CLOTH 2 % EX PADS
6.0000 | MEDICATED_PAD | Freq: Every day | CUTANEOUS | Status: DC
  Administered 2014-09-15: 6 via TOPICAL

## 2014-09-14 MED ORDER — ONDANSETRON HCL 4 MG/2ML IJ SOLN: 4.0000 mg | Freq: Four times a day (QID) | INTRAMUSCULAR | Status: DC | PRN

## 2014-09-14 MED ORDER — SODIUM CHLORIDE 0.9 % WEIGHT BASED INFUSION
3.0000 mL/kg/h | INTRAVENOUS | Status: AC
  Administered 2014-09-14: 3 mL/kg/h via INTRAVENOUS

## 2014-09-14 MED ORDER — SODIUM CHLORIDE 0.9 % IJ SOLN: 3.0000 mL | INTRAMUSCULAR | Status: DC | PRN

## 2014-09-14 MED ORDER — MUPIROCIN 2 % EX OINT
1.0000 "application " | TOPICAL_OINTMENT | Freq: Two times a day (BID) | CUTANEOUS | Status: DC
  Administered 2014-09-14 – 2014-09-15 (×3): 1 via NASAL
  Filled 2014-09-14: qty 22

## 2014-09-14 MED ORDER — MIDAZOLAM HCL 2 MG/2ML IJ SOLN
INTRAMUSCULAR | Status: AC
  Filled 2014-09-14: qty 2

## 2014-09-14 MED ORDER — ASPIRIN 81 MG PO CHEW
81.0000 mg | CHEWABLE_TABLET | ORAL | Status: AC
  Administered 2014-09-14: 81 mg via ORAL
  Filled 2014-09-14: qty 1

## 2014-09-14 SURGICAL SUPPLY — 11 items
CATH INFINITI 5FR MPB2 (CATHETERS) IMPLANT
CATH OPTITORQUE TIG 4.0 5F (CATHETERS) ×3 IMPLANT
DEVICE RAD COMP TR BAND LRG (VASCULAR PRODUCTS) ×3 IMPLANT
GLIDESHEATH SLEND A-KIT 6F 20G (SHEATH) ×3 IMPLANT
KIT HEART LEFT (KITS) ×3 IMPLANT
PACK CARDIAC CATHETERIZATION (CUSTOM PROCEDURE TRAY) ×3 IMPLANT
SHEATH PINNACLE 5F 10CM (SHEATH) IMPLANT
TRANSDUCER W/STOPCOCK (MISCELLANEOUS) ×3 IMPLANT
TUBING CIL FLEX 10 FLL-RA (TUBING) ×3 IMPLANT
WIRE EMERALD 3MM-J .035X150CM (WIRE) IMPLANT
WIRE SAFE-T 1.5MM-J .035X260CM (WIRE) ×3 IMPLANT

## 2014-09-14 NOTE — Progress Notes (Signed)
TR BAND REMOVAL  LOCATION:    right radial  DEFLATED PER PROTOCOL:    Yes.    TIME BAND OFF / DRESSING APPLIED:    1615   SITE UPON ARRIVAL:    Level 0  SITE AFTER BAND REMOVAL:    Level 0  REVERSE ALLEN'S TEST:     positive  CIRCULATION SENSATION AND MOVEMENT:    Within Normal Limits   Yes.    COMMENTS:   Tolerated procedure well 

## 2014-09-14 NOTE — H&P (View-Only) (Signed)
CARDIOLOGY CONSULT NOTE  Patient ID: Damani Kelemen MRN: [id number] DOB/AGE: 1966-03-04 49 y.o.  Admit date: 09/12/2014 Referring Physician  Ladell Heads Rizwan,MD Primary Physician:  Pcp Not In System Reason for Consultation  Syncope  HPI: Jeziel Hoffmann  is a 49 y.o. male  With history of diabetes mellitus, patient has lost about 60 pounds in weight over the past 1 year by making dietary changes and avoiding carbohydrate rich diet, history of heavy tobacco use disorder, no smoking about 2-3 cigarettes a day, presently disabled from a work-related injury remotely, admitted to the hospital with sudden onset of syncope 3. Patient states that he stopped all his medications about a year ago.  First episode of syncope occurred while he was talking to his [place] neighbor sitting on the railing, patient without any warning symptoms fell forward. No injuries. Patient got up felt well and sat up on the railing again and then fell backwards. He hurt his back, then sat back on the railing but felt pain in the back, hence started walking towards her room, when he suddenly fell to the floor again. EMS was activated and he was brought to the emergency room. She has not had any recurrence since being admitted to the hospital.  Denies any premonitory symptoms, denies any chest pain. He states that he used to have shortness of breath with exertional activities in the past but having lost weight and quit smoking heavily, states that he is able to do all his chores without any limitations. He does complain of left hip discomfort with activity and feels like it is cramping in his left buttock area. He denies any symptoms of neurologic deficits, no bowel or bladder disturbances, no convulsions, did not have any incontinence with the episode.  Patient states that he has had similar episode about a year ago.  Past Medical History  Diagnosis Date  . Diabetes mellitus without complication   . COPD (chronic obstructive  pulmonary disease)   . Sleep apnea   . Depression   . Tobacco abuse      Past Surgical History  Procedure Laterality Date  . Hand surgery Right   . Pilonidal cyst excision       History reviewed.  Family history: Positive for diabetes mellitus, no history of Provigil coronary artery disease.  Social History: History   Social History  . Marital Status: Single    Spouse Name: N/A  . Number of Children: N/A  . Years of Education: N/A   Occupational History  . Not on file.   Social History Main Topics  . Smoking status: Current Some Day Smoker -- 0.00 packs/day for 41 years  . Smokeless tobacco: Not on file  . Alcohol Use: No  . Drug Use: Yes    Special: Marijuana  . Sexual Activity: Not on file   Other Topics Concern  . Not on file   Social History Narrative  Patient smokes about 2-3 cigarettes a day. Does not drink alcohol, states that he was a heavy alcohol drinker about 20-30 years ago, has had GI bleed and since then has quit. He has had half a glass of beer about a couple months ago. Used to smoke marijuana in the past, no use of other illicit drugs.    ROS: General: no fevers/chills/night sweats Eyes: no blurry vision, diplopia, or amaurosis ENT: no sore throat or hearing loss Resp: no cough, wheezing, or hemoptysis CV: no edema or palpitations GI: no abdominal pain, nausea, vomiting, diarrhea, or constipation GU:  no dysuria, frequency, or hematuria Skin: no rash Neuro: no headache, numbness, tingling, or weakness of extremities Musculoskeletal: complaints of back pain since the fall and syncope. 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 Endo: no polydipsia or polyuria    Physical Exam: Blood pressure 129/78, pulse 73, temperature 97.5 F (36.4 C), temperature source Oral, resp. rate 18, height 5\' 10"  (1.778 m), weight 99 kg (218 lb 4.1 oz), SpO2 100 %.  Body mass index is 31.32 kg/(m^2).   General appearance: alert, cooperative,  appears stated age, no distress and mildly obese Lungs: clear to auscultation bilaterally Chest wall: no tenderness Heart: regular rate and rhythm, S1, S2 normal, no murmur, click, rub or gallop Abdomen: soft, non-tender; bowel sounds normal; no masses,  no organomegaly Extremities: extremities normal, atraumatic, no cyanosis or edema Pulses: Carotids 2+ without any bruit, left femoral pulse 1-2+, right femoral pulse to 3+. Popliteal pulse 2+ bilaterally, pedal pulses faint bilaterally. Capillary refill normal. Neurologic: Grossly normal  Labs:   Lab Results  Component Value Date   WBC 10.2 09/12/2014   HGB 16.4 09/12/2014   HCT 45.9 09/12/2014   MCV 93.9 09/12/2014   PLT 191 09/12/2014    Recent Labs Lab 09/12/14 1847  NA 138  K 4.1  CL 106  CO2 25  BUN 16  CREATININE 1.01  CALCIUM 9.4  PROT 6.9  BILITOT 0.4  ALKPHOS 73  ALT 20  AST 20  GLUCOSE 127*   Cardiac Panel (last 3 results)  Recent Labs  09/12/14 1847  TROPONINI <0.03    Lab Results  Component Value Date   TROPONINI <0.03 09/12/2014     EKG: 09/12/2014: Normal sinus rhythm, normal axis. Nonspecific T inversions in the inferior leads, T wave inversion in anterolateral leads, borderline QT prolongation, cannot exclude anterolateral ischemia.  Echo:  09/13/2014: Normal LV systolic function, ejection fraction 55-60% without regional wall motion of inability. Mild biatrial enlargement.    Radiology: Chest x-ray 09/12/2014: No acute abnormality.   Scheduled Meds: . enoxaparin (LOVENOX) injection  40 mg Subcutaneous Q24H  . sodium chloride  3 mL Intravenous Q12H   Continuous Infusions: . sodium chloride 125 mL/hr at 09/13/14 0734   PRN Meds:.HYDROcodone-acetaminophen  ASSESSMENT AND PLAN:  1. Syncope, cannot exclude Cardiogenic syncope. No premonitory symptoms.  2. Abnormal EKG, cannot exclude anterolateral ischemia. 3. History of tobacco use disorder 4. Hyperglycemia  Recommendation: Patient  presentation is concerning for cardiac etiology for syncope. Would recommend coronary angiography due to his underlying cardiovascular status to exclude ischemic etiology, if coronary artery disease is ruled out, he will need event monitoring for 30 days. No changes in the medications were done today. I'll obtain lipid profile along with TSH and HbA1c. We have already discussed and planned to obtaining d-dimer, if abnormal patient will need CT scan of the chest to exclude pulmonary embolism. I'm also repeating the serum troponin. Continue to make update.   Discussed risks, benefits and alternatives of angiogram including but not limited to <1% risk of death, stroke, MI, need for urgent surgical revascularization, renal failure, but not limited to thest. patient is willing to proceed.  Yates Decamp, MD 09/13/2014, 4:02 PM [facility] [place] Pager: [phone] Office: [phone] If no answer Cell [phone]

## 2014-09-14 NOTE — Progress Notes (Addendum)
[facility] Progress Note   Jim Peterson ZOX:[id number] DOB: [birth year] DOA: 09/12/2014 PCP: Pcp Not In System  Brief narrative: Jim Peterson is a 49 y.o. male with history of smoking who was previously a diabetic with resolution once he lost weight. The patient presents with syncope. He initially fell off the railing of the porch and hit his head on the stairs. He then got up and again to walk and fell and became unresponsive once again EMS brought him into the hospital. He had a similar episode last summer. He does not recall having any chest pain focal numbness weakness tingling or change in vision prior to or after the episode.   Subjective: No complaints.   Assessment/Plan: Active Problems:   Syncope - orthostatic vs cardiogenic  -Dr. Jacinto Halim performed cardiac cath- mild CAD only  -patient will have an event monitor placed tomorrow per cardiology  -Dimer elevated - neg CT of the chest for PE - EEG negative for seizure predisposition  - head CT negative  Orthostatic hypotension -May have contributed to above -- orthostatics now normal after aggressive hydration- will d/c IVF  Cigarette Smoker -has been advised to discontinue   Code Status: Full Code Family Communication:  DVT prophylaxis: Lovenox Consultants: Cardiology Procedures:  Antibiotics: Anti-infectives    None      Objective: Filed Weights   09/12/14 2310 09/13/14 0518 09/14/14 0500  Weight: 98.793 kg (217 lb 12.8 oz) 99 kg (218 lb 4.1 oz) 99.4 kg (219 lb 2.2 oz)    Intake/Output Summary (Last 24 hours) at 09/14/14 1338 Last data filed at 09/14/14 1230  Gross per 24 hour  Intake   2465 ml  Output   2950 ml  Net   -485 ml     Vitals Filed Vitals:   09/14/14 1316 09/14/14 1321 09/14/14 1326 09/14/14 1331  BP: 107/69 117/79    Pulse: 96 63 0 0  Temp:      TempSrc:      Resp: 33 0 0 0  Height:      Weight:      SpO2: 97% 98% 0% 0%    Exam:  General:  Pt is alert, not in acute  distress  HEENT: No icterus, No thrush, oral mucosa moist  Cardiovascular: regular rate and rhythm, S1/S2 No murmur  Respiratory: clear to auscultation bilaterally   Abdomen: Soft, +Bowel sounds, non tender, non distended, no guarding  MSK: No LE edema, cyanosis or clubbing  Data Reviewed: Basic Metabolic Panel:  Recent Labs Lab 09/12/14 1847  NA 138  K 4.1  CL 106  CO2 25  GLUCOSE 127*  BUN 16  CREATININE 1.01  CALCIUM 9.4   Liver Function Tests:  Recent Labs Lab 09/12/14 1847  AST 20  ALT 20  ALKPHOS 73  BILITOT 0.4  PROT 6.9  ALBUMIN 3.7   No results for input(s): LIPASE, AMYLASE in the last 168 hours. No results for input(s): AMMONIA in the last 168 hours. CBC:  Recent Labs Lab 09/12/14 1847  WBC 10.2  NEUTROABS 7.5  HGB 16.4  HCT 45.9  MCV 93.9  PLT 191   Cardiac Enzymes:  Recent Labs Lab 09/12/14 1847 09/13/14 1719  TROPONINI <0.03 <0.03   BNP (last 3 results) No results for input(s): BNP in the last 8760 hours.  ProBNP (last 3 results) No results for input(s): PROBNP in the last 8760 hours.  CBG:  Recent Labs Lab 09/13/14 0808 09/14/14 0756  GLUCAP 101* 92    Recent Results (  from the past 240 hour(s))  Surgical pcr screen     Status: Abnormal   Collection Time: 09/14/14  5:49 AM  Result Value Ref Range Status   MRSA, PCR POSITIVE (A) NEGATIVE Final   Staphylococcus aureus POSITIVE (A) NEGATIVE Final    Comment:        The Xpert SA Assay (FDA approved for NASAL specimens in patients over 33 years of age), is one component of a comprehensive surveillance program.  Test performance has been validated by [facility] for patients greater than or equal to 10 year old. It is not intended to diagnose infection nor to guide or monitor treatment.      Studies: Dg Chest 1 View  09/12/2014   CLINICAL DATA:  Fall to ground with back pain.  No chest complaints.  EXAM: CHEST  1 VIEW  COMPARISON:  None.  FINDINGS: Normal heart  size. Normal upper mediastinal width for supine technique; the aortic knob remains visible.  No acute infiltrate or edema. No effusion or pneumothorax. No acute osseous findings.  IMPRESSION: Negative portable chest.   Electronically Signed   By: Marnee Spring M.D.   On: 09/12/2014 18:31   Dg Thoracic Spine 2 View  09/12/2014   CLINICAL DATA:  49 year old male with a history of fall. Thoracic back pain  EXAM: THORACIC SPINE - 2 VIEW  COMPARISON:  None.  FINDINGS: Thoracic Spine:  Thoracic vertebral elements maintain normal anatomic alignment, with no evidence of anterolisthesis, retrolisthesis, or subluxation.  Irregularity of the T12 vertebral body, with less than 30% anterior height loss. No comparison available.  No significant endplate changes or facet disease.  Unremarkable appearance of the visualized thorax.  IMPRESSION: Irregularity of the T12 vertebral body with less than 30% anterior height loss. Deformity is age-indeterminate, and if there is concern for acute abnormality, recommend correlation with thoracic MRI.  Signed,  Yvone Neu. Loreta Ave, DO  [facility]  [facility]   Electronically Signed   By: Gilmer Mor D.O.   On: 09/12/2014 18:33   Dg Lumbar Spine Complete  09/12/2014   CLINICAL DATA:  Patient status post fall while sitting on railing. Syncopal episode. Initial encounter.  EXAM: LUMBAR SPINE - COMPLETE 4+ VIEW  COMPARISON:  None.  FINDINGS: Normal anatomic alignment. Age-indeterminate anterior height loss of the T12, L1, L3 and L4 vertebral bodies. Multilevel degenerative disc disease most pronounced L2-3 and L4-5. L4-5 and L5-S1 facet degenerative changes. SI joints are unremarkable. Visualized bowel gas pattern is unremarkable.  IMPRESSION: Age-indeterminate anterior height loss of the T12, L1, L3 and L4 vertebral bodies. Recommend correlation for point tenderness.  Lower lumbar spine degenerative changes.   Electronically Signed   By: Annia Belt M.D.   On: 09/12/2014 18:34   Dg Pelvis 1-2 Views  09/12/2014   CLINICAL DATA:  Pain fell from porch  EXAM: PELVIS - 1-2 VIEW  COMPARISON:  None.  FINDINGS: There is no evidence of pelvic fracture or dislocation. Joint spaces appear intact. No erosive change. Note that there is an assimilation joint on the left with a transitional lumbosacral vertebra.  IMPRESSION: No fracture or dislocation.  No appreciable arthropathy.   Electronically Signed   By: Bretta Bang III M.D.   On: 09/12/2014 18:31   Ct Head Wo Contrast  09/12/2014   CLINICAL DATA:  Status post 5 foot fall today with a blow to the head. Loss of consciousness and subsequent episode of syncope x2. Right side headache. Initial  encounter.  EXAM: CT HEAD WITHOUT CONTRAST  CT CERVICAL SPINE WITHOUT CONTRAST  TECHNIQUE: Multidetector CT imaging of the head and cervical spine was performed following the standard protocol without intravenous contrast. Multiplanar CT image reconstructions of the cervical spine were also generated.  COMPARISON:  None.  FINDINGS: CT HEAD FINDINGS  There is no evidence of acute intracranial abnormality including hemorrhage, infarct, mass lesion, mass effect, midline shift or abnormal extra-axial fluid collection. Circumferential mucosal thickening is seen in the sphenoid and maxillary sinuses bilaterally and scattered ethmoid air cells. There is no fracture.  CT CERVICAL SPINE FINDINGS  Vertebral body height and alignment are maintained. Intervertebral disc space height is unremarkable. The lung apices are clear.  IMPRESSION: No acute abnormality head or cervical spine.  Sinus disease.   Electronically Signed   By: Drusilla Kanner M.D.   On: 09/12/2014 19:28   Ct Angio Chest Pe W/cm &/or Wo Cm  09/13/2014   CLINICAL DATA:  Syncope.  Became unresponsive.  Initial encounter.  EXAM: CT ANGIOGRAPHY CHEST WITH CONTRAST  TECHNIQUE: Multidetector CT imaging of the chest was performed using the standard protocol during  bolus administration of intravenous contrast. Multiplanar CT image reconstructions and MIPs were obtained to evaluate the vascular anatomy.  CONTRAST:  48mL OMNIPAQUE IOHEXOL 350 MG/ML SOLN  COMPARISON:  Chest radiograph performed 09/12/2014  FINDINGS: There is no evidence of pulmonary embolus.  Bibasilar atelectasis or scarring is noted. The lungs are otherwise clear. There is no evidence of significant focal consolidation, pleural effusion or pneumothorax. No masses are identified; no abnormal focal contrast enhancement is seen.  Mild scattered coronary artery calcification is noted. The mediastinum is otherwise unremarkable. Visualized mediastinal nodes remain normal in size. The great vessels are grossly unremarkable in appearance. No pericardial effusion is identified. No axillary lymphadenopathy is seen. The visualized portions of the thyroid gland are unremarkable in appearance.  The visualized portions of the liver and spleen are unremarkable.  No acute osseous abnormalities are seen.  Review of the MIP images confirms the above findings.  IMPRESSION: 1. No evidence of pulmonary embolus. 2. Bibasilar atelectasis or scarring noted.  Lungs otherwise clear. 3. Mild scattered coronary artery calcifications seen.   Electronically Signed   By: Roanna Raider M.D.   On: 09/13/2014 22:11   Ct Cervical Spine Wo Contrast  09/12/2014   CLINICAL DATA:  Status post 5 foot fall today with a blow to the head. Loss of consciousness and subsequent episode of syncope x2. Right side headache. Initial encounter.  EXAM: CT HEAD WITHOUT CONTRAST  CT CERVICAL SPINE WITHOUT CONTRAST  TECHNIQUE: Multidetector CT imaging of the head and cervical spine was performed following the standard protocol without intravenous contrast. Multiplanar CT image reconstructions of the cervical spine were also generated.  COMPARISON:  None.  FINDINGS: CT HEAD FINDINGS  There is no evidence of acute intracranial abnormality including hemorrhage,  infarct, mass lesion, mass effect, midline shift or abnormal extra-axial fluid collection. Circumferential mucosal thickening is seen in the sphenoid and maxillary sinuses bilaterally and scattered ethmoid air cells. There is no fracture.  CT CERVICAL SPINE FINDINGS  Vertebral body height and alignment are maintained. Intervertebral disc space height is unremarkable. The lung apices are clear.  IMPRESSION: No acute abnormality head or cervical spine.  Sinus disease.   Electronically Signed   By: Drusilla Kanner M.D.   On: 09/12/2014 19:28    Scheduled Meds:  Scheduled Meds: . [MAR Hold] aspirin  81 mg Oral Daily  . [  MAR Hold] Chlorhexidine Gluconate Cloth  6 each Topical Q0600  . [MAR Hold] enoxaparin (LOVENOX) injection  40 mg Subcutaneous Q24H  . [MAR Hold] mupirocin ointment  1 application Nasal BID  . [MAR Hold] sodium chloride  3 mL Intravenous Q12H  . sodium chloride  3 mL Intravenous Q12H   Continuous Infusions: . sodium chloride 125 mL/hr at 09/14/14 0835  . sodium chloride      Time spent on care of this patient: 35 min   Lewie Deman, MD 09/14/2014, 1:38 PM    [facility] Office  [phone] Pager - Text Page per [email or web address] If 7PM-7AM, please contact night-coverage [email or web address]

## 2014-09-14 NOTE — Progress Notes (Signed)
Consult placed for CM regarding pt with no PCP, no glucometer and requesting nebulizer equipment.  CM provided pt with [facility] information to aid in establishing PCP. Pt with hx of DM stated recently moved from [place] and has no glucometer, CM provided pt's nurse with prescription for glucometer, MD  needs to  fill out prior to d/c to obtain glucometer @ discharge. If nebulizer equipment is needed , order for DME ( NEBULIZER )  is to be placed by MD. CM to f/u with d/c disposition. Gae Gallop RN,BSN,CM [phone]

## 2014-09-14 NOTE — Interval H&P Note (Signed)
History and Physical Interval Note:  09/14/2014 1:02 PM  Jim Peterson  has presented today for surgery, with the diagnosis of cp  The various methods of treatment have been discussed with the patient and family. After consideration of risks, benefits and other options for treatment, the patient has consented to  Procedure(s): Left Heart Cath and Coronary Angiography (N/A) and possible PCI as a surgical intervention .  The patient's history has been reviewed, patient examined, no change in status, stable for surgery.  I have reviewed the patient's chart and labs.  Questions were answered to the patient's satisfaction.   Cath Lab Visit (complete for each Cath Lab visit)  Clinical Evaluation Leading to the Procedure:   ACS: Yes.    Non-ACS:    Anginal Classification: CCS IV. Patient presenting with sudden unexplained syncope.  Anti-ischemic medical therapy: No Therapy  Non-Invasive Test Results: No non-invasive testing performed  Prior CABG: No previous CABG   Procedure being observed by Ms. Shirlyn Goltz PA student and Dr.  Wenda Low ([facility] resident) and patient consents to this.        Yates Decamp

## 2014-09-15 LAB — GLUCOSE, CAPILLARY: GLUCOSE-CAPILLARY: 100 mg/dL — AB (ref 65–99)

## 2014-09-15 LAB — HEMOGLOBIN A1C
Hgb A1c MFr Bld: 6.5 % — ABNORMAL HIGH (ref 4.8–5.6)
Mean Plasma Glucose: 140 mg/dL

## 2014-09-15 MED ORDER — HYDROCODONE-ACETAMINOPHEN 5-325 MG PO TABS: 1.0000 | ORAL_TABLET | Freq: Four times a day (QID) | ORAL | Status: DC | PRN

## 2014-09-15 MED ORDER — BLOOD GLUC METER DISP-STRIPS DEVI: Status: DC

## 2014-09-15 MED ORDER — ASPIRIN 81 MG PO CHEW: 81.0000 mg | CHEWABLE_TABLET | Freq: Every day | ORAL | Status: DC

## 2014-09-15 MED ORDER — METFORMIN HCL 500 MG PO TABS: 500.0000 mg | ORAL_TABLET | Freq: Two times a day (BID) | ORAL | Status: DC

## 2014-09-15 MED ORDER — FREESTYLE SYSTEM KIT: 1.0000 | PACK | Status: DC | PRN

## 2014-09-15 MED FILL — Nitroglycerin IV Soln 100 MCG/ML in D5W: INTRA_ARTERIAL | Qty: 10 | Status: AC

## 2014-09-15 MED FILL — Heparin Sodium (Porcine) 2 Unit/ML in Sodium Chloride 0.9%: INTRAMUSCULAR | Qty: 1500 | Status: AC

## 2014-09-15 MED FILL — Lidocaine HCl Local Preservative Free (PF) Inj 1%: INTRAMUSCULAR | Qty: 30 | Status: AC

## 2014-09-15 NOTE — Discharge Summary (Addendum)
Physician Discharge Summary  Jim Peterson MRN: [id number] DOB/AGE: [birth year] 49 y.o.  PCP: Pcp Not In System   Admit date: 09/12/2014 Discharge date: 09/15/2014  Discharge Diagnoses:     Active Problems:   Syncope   Syncopal episodes   Positive D dimer   Orthostatic hypotension   Smoker    Follow-up recommendations Follow-up with PCP in 3-5 days , including although additional recommended appointments as below Follow-up CBC, CMP in 3-5 days      Medication List    TAKE these medications        aspirin 81 MG chewable tablet  Chew 1 tablet (81 mg total) by mouth daily.     BLOOD GLUCOSE METER DISPOSABLE Devi  100     glucose monitoring kit monitoring kit  1 each by Does not apply route as needed for other.     HYDROcodone-acetaminophen 5-325 MG per tablet  Commonly known as:  NORCO/VICODIN  Take 1 tablet by mouth every 6 (six) hours as needed for moderate pain or severe pain.      Metformin 500 mg by mouth twice a day    Discharge Condition: *Stable  Disposition: 01-Home or Self Care   Consults:  Cardiology   Significant Diagnostic Studies:  Dg Chest 1 View  09/12/2014   CLINICAL DATA:  Fall to ground with back pain.  No chest complaints.  EXAM: CHEST  1 VIEW  COMPARISON:  None.  FINDINGS: Normal heart size. Normal upper mediastinal width for supine technique; the aortic knob remains visible.  No acute infiltrate or edema. No effusion or pneumothorax. No acute osseous findings.  IMPRESSION: Negative portable chest.   Electronically Signed   By: Monte Fantasia M.D.   On: 09/12/2014 18:31   Dg Thoracic Spine 2 View  09/12/2014   CLINICAL DATA:  49 year old male with a history of fall. Thoracic back pain  EXAM: THORACIC SPINE - 2 VIEW  COMPARISON:  None.  FINDINGS: Thoracic Spine:  Thoracic vertebral elements maintain normal anatomic alignment, with no evidence of anterolisthesis, retrolisthesis, or subluxation.  Irregularity of the T12 vertebral body,  with less than 30% anterior height loss. No comparison available.  No significant endplate changes or facet disease.  Unremarkable appearance of the visualized thorax.  IMPRESSION: Irregularity of the T12 vertebral body with less than 30% anterior height loss. Deformity is age-indeterminate, and if there is concern for acute abnormality, recommend correlation with thoracic MRI.  Signed,  Dulcy Fanny. Earleen Newport, DO  [facility]  [facility]   Electronically Signed   By: Corrie Mckusick D.O.   On: 09/12/2014 18:33   Dg Lumbar Spine Complete  09/12/2014   CLINICAL DATA:  Patient status post fall while sitting on railing. Syncopal episode. Initial encounter.  EXAM: LUMBAR SPINE - COMPLETE 4+ VIEW  COMPARISON:  None.  FINDINGS: Normal anatomic alignment. Age-indeterminate anterior height loss of the T12, L1, L3 and L4 vertebral bodies. Multilevel degenerative disc disease most pronounced L2-3 and L4-5. L4-5 and L5-S1 facet degenerative changes. SI joints are unremarkable. Visualized bowel gas pattern is unremarkable.  IMPRESSION: Age-indeterminate anterior height loss of the T12, L1, L3 and L4 vertebral bodies. Recommend correlation for point tenderness.  Lower lumbar spine degenerative changes.   Electronically Signed   By: Lovey Newcomer M.D.   On: 09/12/2014 18:34   Dg Pelvis 1-2 Views  09/12/2014   CLINICAL DATA:  Pain fell from porch  EXAM: PELVIS - 1-2 VIEW  COMPARISON:  None.  FINDINGS:  There is no evidence of pelvic fracture or dislocation. Joint spaces appear intact. No erosive change. Note that there is an assimilation joint on the left with a transitional lumbosacral vertebra.  IMPRESSION: No fracture or dislocation.  No appreciable arthropathy.   Electronically Signed   By: Lowella Grip III M.D.   On: 09/12/2014 18:31   Ct Head Wo Contrast  09/12/2014   CLINICAL DATA:  Status post 5 foot fall today with a blow to the head. Loss of consciousness and subsequent  episode of syncope x2. Right side headache. Initial encounter.  EXAM: CT HEAD WITHOUT CONTRAST  CT CERVICAL SPINE WITHOUT CONTRAST  TECHNIQUE: Multidetector CT imaging of the head and cervical spine was performed following the standard protocol without intravenous contrast. Multiplanar CT image reconstructions of the cervical spine were also generated.  COMPARISON:  None.  FINDINGS: CT HEAD FINDINGS  There is no evidence of acute intracranial abnormality including hemorrhage, infarct, mass lesion, mass effect, midline shift or abnormal extra-axial fluid collection. Circumferential mucosal thickening is seen in the sphenoid and maxillary sinuses bilaterally and scattered ethmoid air cells. There is no fracture.  CT CERVICAL SPINE FINDINGS  Vertebral body height and alignment are maintained. Intervertebral disc space height is unremarkable. The lung apices are clear.  IMPRESSION: No acute abnormality head or cervical spine.  Sinus disease.   Electronically Signed   By: Inge Rise M.D.   On: 09/12/2014 19:28   Ct Angio Chest Pe W/cm &/or Wo Cm  09/13/2014   CLINICAL DATA:  Syncope.  Became unresponsive.  Initial encounter.  EXAM: CT ANGIOGRAPHY CHEST WITH CONTRAST  TECHNIQUE: Multidetector CT imaging of the chest was performed using the standard protocol during bolus administration of intravenous contrast. Multiplanar CT image reconstructions and MIPs were obtained to evaluate the vascular anatomy.  CONTRAST:  51m OMNIPAQUE IOHEXOL 350 MG/ML SOLN  COMPARISON:  Chest radiograph performed 09/12/2014  FINDINGS: There is no evidence of pulmonary embolus.  Bibasilar atelectasis or scarring is noted. The lungs are otherwise clear. There is no evidence of significant focal consolidation, pleural effusion or pneumothorax. No masses are identified; no abnormal focal contrast enhancement is seen.  Mild scattered coronary artery calcification is noted. The mediastinum is otherwise unremarkable. Visualized mediastinal  nodes remain normal in size. The great vessels are grossly unremarkable in appearance. No pericardial effusion is identified. No axillary lymphadenopathy is seen. The visualized portions of the thyroid gland are unremarkable in appearance.  The visualized portions of the liver and spleen are unremarkable.  No acute osseous abnormalities are seen.  Review of the MIP images confirms the above findings.  IMPRESSION: 1. No evidence of pulmonary embolus. 2. Bibasilar atelectasis or scarring noted.  Lungs otherwise clear. 3. Mild scattered coronary artery calcifications seen.   Electronically Signed   By: JGarald BaldingM.D.   On: 09/13/2014 22:11   Ct Cervical Spine Wo Contrast  09/12/2014   CLINICAL DATA:  Status post 5 foot fall today with a blow to the head. Loss of consciousness and subsequent episode of syncope x2. Right side headache. Initial encounter.  EXAM: CT HEAD WITHOUT CONTRAST  CT CERVICAL SPINE WITHOUT CONTRAST  TECHNIQUE: Multidetector CT imaging of the head and cervical spine was performed following the standard protocol without intravenous contrast. Multiplanar CT image reconstructions of the cervical spine were also generated.  COMPARISON:  None.  FINDINGS: CT HEAD FINDINGS  There is no evidence of acute intracranial abnormality including hemorrhage, infarct, mass lesion, mass effect, midline shift or  abnormal extra-axial fluid collection. Circumferential mucosal thickening is seen in the sphenoid and maxillary sinuses bilaterally and scattered ethmoid air cells. There is no fracture.  CT CERVICAL SPINE FINDINGS  Vertebral body height and alignment are maintained. Intervertebral disc space height is unremarkable. The lung apices are clear.  IMPRESSION: No acute abnormality head or cervical spine.  Sinus disease.   Electronically Signed   By: Inge Rise M.D.   On: 09/12/2014 19:28    2-D echo Left ventricle: The cavity size was normal. Wall thickness was normal. Systolic function was  normal. The estimated ejection fraction was in the range of 55% to 60%. Wall motion was normal; there were no regional wall motion abnormalities. - Left atrium: The atrium was mildly dilated. - Right atrium: The atrium was mildly dilated.  Cardiac cath  Prox LAD to Mid LAD lesion, 15% stenosed.  The left ventricular systolic function is normal.  Patient will need event monitoring on discharge.  Filed Weights   09/13/14 0518 09/14/14 0500 09/15/14 0100  Weight: 99 kg (218 lb 4.1 oz) 99.4 kg (219 lb 2.2 oz) 99.8 kg (220 lb 0.3 oz)     Microbiology: Recent Results (from the past 240 hour(s))  Surgical pcr screen     Status: Abnormal   Collection Time: 09/14/14  5:49 AM  Result Value Ref Range Status   MRSA, PCR POSITIVE (A) NEGATIVE Final   Staphylococcus aureus POSITIVE (A) NEGATIVE Final    Comment:        The Xpert SA Assay (FDA approved for NASAL specimens in patients over 59 years of age), is one component of a comprehensive surveillance program.  Test performance has been validated by [facility] for patients greater than or equal to 53 year old. It is not intended to diagnose infection nor to guide or monitor treatment.        Blood Culture No results found for: SDES, [place], CULT, REPTSTATUS    Labs: Results for orders placed or performed during the hospital encounter of 09/12/14 (from the past 48 hour(s))  D-dimer, quantitative (not at [facility])     Status: Abnormal   Collection Time: 09/13/14  3:31 PM  Result Value Ref Range   D-Dimer, Quant 1.16 (H) 0.00 - 0.48 ug/mL-FEU    Comment:        AT THE INHOUSE ESTABLISHED CUTOFF VALUE OF 0.48 ug/mL FEU, THIS ASSAY HAS BEEN DOCUMENTED IN THE LITERATURE TO HAVE A SENSITIVITY AND NEGATIVE PREDICTIVE VALUE OF AT LEAST 98 TO 99%.  THE TEST RESULT SHOULD BE CORRELATED WITH AN ASSESSMENT OF THE CLINICAL PROBABILITY OF DVT / VTE.   Troponin I     Status: None   Collection Time: 09/13/14  5:19 PM   Result Value Ref Range   Troponin I <0.03 <0.031 ng/mL    Comment:        NO INDICATION OF MYOCARDIAL INJURY.   Surgical pcr screen     Status: Abnormal   Collection Time: 09/14/14  5:49 AM  Result Value Ref Range   MRSA, PCR POSITIVE (A) NEGATIVE   Staphylococcus aureus POSITIVE (A) NEGATIVE    Comment:        The Xpert SA Assay (FDA approved for NASAL specimens in patients over 12 years of age), is one component of a comprehensive surveillance program.  Test performance has been validated by [facility] for patients greater than or equal to 102 year old. It is not intended to diagnose infection nor to guide or monitor  treatment.   Lipid panel     Status: Abnormal   Collection Time: 09/14/14  6:00 AM  Result Value Ref Range   Cholesterol 151 0 - 200 mg/dL   Triglycerides 63 <150 mg/dL   HDL 36 (L) >40 mg/dL   Total CHOL/HDL Ratio 4.2 RATIO   VLDL 13 0 - 40 mg/dL   LDL Cholesterol 102 (H) 0 - 99 mg/dL    Comment:        Total Cholesterol/HDL:CHD Risk Coronary Heart Disease Risk Table                     Men   Women  1/2 Average Risk   3.4   3.3  Average Risk       5.0   4.4  2 X Average Risk   9.6   7.1  3 X Average Risk  23.4   11.0        Use the calculated Patient Ratio above and the CHD Risk Table to determine the patient's CHD Risk.        ATP III CLASSIFICATION (LDL):  <100     mg/dL   Optimal  100-129  mg/dL   Near or Above                    Optimal  130-159  mg/dL   Borderline  160-189  mg/dL   High  >190     mg/dL   Very High   TSH     Status: Abnormal   Collection Time: 09/14/14  6:00 AM  Result Value Ref Range   TSH 7.917 (H) 0.350 - 4.500 uIU/mL  Hemoglobin A1c     Status: Abnormal   Collection Time: 09/14/14  6:00 AM  Result Value Ref Range   Hgb A1c MFr Bld 6.5 (H) 4.8 - 5.6 %    Comment: (NOTE)         Pre-diabetes: 5.7 - 6.4         Diabetes: >6.4         Glycemic control for adults with diabetes: <7.0    Mean Plasma Glucose 140  mg/dL    Comment: (NOTE) Performed At: [facility] [address] [place], [place] [id number] Lindon Romp MD [place]:[id number]   Glucose, capillary     Status: None   Collection Time: 09/14/14  7:56 AM  Result Value Ref Range   Glucose-Capillary 92 65 - 99 mg/dL  Protime-INR     Status: None   Collection Time: 09/14/14  8:40 AM  Result Value Ref Range   Prothrombin Time 13.3 11.6 - 15.2 seconds   INR 0.99 0.00 - 1.49  Glucose, capillary     Status: None   Collection Time: 09/14/14  2:02 PM  Result Value Ref Range   Glucose-Capillary 84 65 - 99 mg/dL  Glucose, capillary     Status: Abnormal   Collection Time: 09/14/14  5:16 PM  Result Value Ref Range   Glucose-Capillary 179 (H) 65 - 99 mg/dL  Glucose, capillary     Status: None   Collection Time: 09/14/14 10:25 PM  Result Value Ref Range   Glucose-Capillary 95 65 - 99 mg/dL   Comment 1 Notify RN    Comment 2 Document in Chart   Glucose, capillary     Status: Abnormal   Collection Time: 09/15/14  5:35 AM  Result Value Ref Range   Glucose-Capillary 100 (H) 65 - 99 mg/dL  Lipid Panel     Component Value Date/Time   CHOL 151 09/14/2014 0600   TRIG 63 09/14/2014 0600   HDL 36* 09/14/2014 0600   CHOLHDL 4.2 09/14/2014 0600   VLDL 13 09/14/2014 0600   LDLCALC 102* 09/14/2014 0600     Lab Results  Component Value Date   HGBA1C 6.5* 09/14/2014     Lab Results  Component Value Date   LDLCALC 102* 09/14/2014   CREATININE 1.01 09/12/2014     HPI :*49 y.o. male With history of diabetes mellitus, patient has lost about 60 pounds in weight over the past 1 year by making dietary changes and avoiding carbohydrate rich diet, history of heavy tobacco use disorder, no smoking about 2-3 cigarettes a day, presently disabled from a work-related injury remotely, admitted to the hospital with sudden onset of syncope 3. Patient states that he stopped all his medications about a year ago.  First episode of syncope  occurred while he was talking to his [place] neighbor sitting on the railing, patient without any warning symptoms fell forward. No injuries. Patient got up felt well and sat up on the railing again and then fell backwards. He hurt his back, then sat back on the railing but felt pain in the back, hence started walking towards her room, when he suddenly fell to the floor again. EMS was activated and he was brought to the emergency room. She has not had any recurrence since being admitted to the hospital.  Denies any premonitory symptoms, denies any chest pain. He states that he used to have shortness of breath with exertional activities in the past but having lost weight and quit smoking heavily, states that he is able to do all his chores without any limitations. He does complain of left hip discomfort with activity and feels like it is cramping in his left buttock area. He denies any symptoms of neurologic deficits, no bowel or bladder disturbances, no convulsions, did not have any incontinence with the episode.  HOSPITAL COURSE:   Syncope - orthostatic vs cardiogenic -Dr. Einar Gip performed cardiac cath- mild CAD only  -patient will have an event monitor placed  , if no significant arrhythmias on Event Monitor, consider Neuro evaluation to rule out underlying seizures,   -Dimer elevated - neg CT of the chest for PE - EEG on 6/20 was negative for seizure predisposition  - head CT negative Discussed with cardiology did recommend to continue a baby aspirin  Diabetes Hemoglobin A1c 6.5 Glucometer, glucose strips provided Patient will be started on metformin  Orthostatic hypotension -May have contributed to above -- orthostatics now normal after aggressive hydration- will d/c IVF  Cigarette Smoker -has been advised to discontinue    Discharge Exam:    Blood pressure 91/65, pulse 75, temperature 97.7 F (36.5 C), temperature source Oral, resp. rate 20, height _0  (1.778 m), weight 99.8  kg (220 lb 0.3 oz), SpO2 99 %.  General appearance: alert, cooperative, appears stated age, no distress and mildly obese Lungs: clear to auscultation bilaterally Chest wall: no tenderness Heart: regular rate and rhythm, S1, S2 normal, no murmur, click, rub or gallop Abdomen: soft, non-tender; bowel sounds normal; no masses, no organomegaly Extremities: extremities normal, atraumatic, no cyanosis or edema Pulses: Carotids 2+ without any bruit, left femoral pulse 1-2+, right femoral pulse 2+. Popliteal pulse 2+ bilaterally, pedal pulses faint bilaterally. Capillary refill normal. Right radial access site asymptomatic. Neurologic: Grossly normal       Discharge Instructions  Diet - low sodium heart healthy    Complete by:  As directed      Diet - low sodium heart healthy    Complete by:  As directed      Increase activity slowly    Complete by:  As directed      Increase activity slowly    Complete by:  As directed            Follow-up Information    Follow up with pcp. Schedule an appointment as soon as possible for a visit in 3 days.      Follow up with Adrian Prows, MD On 09/17/2014.   Specialty:  Cardiology   Why:  To come to the office for Event Monitor for 30 days to come at 10:30 AM   Contact information:   [place] [address] [phone]       Signed: Reyne Dumas 09/15/2014, 10:22 AM        Time spent >45 mins

## 2014-09-15 NOTE — Progress Notes (Signed)
Subjective:  Feeling well, no recurrence of syncope or pre-syncopal episodes.   Objective:  Vital Signs in the last 24 hours: Temp:  [94.7 F (34.8 C)-98.3 F (36.8 C)] 97.7 F (36.5 C) (06/22 0803) Pulse Rate:  [0-135] 75 (06/22 0803) Resp:  [0-65] 20 (06/22 0803) BP: (91-161)/(63-99) 91/65 mmHg (06/22 0803) SpO2:  [0 %-100 %] 99 % (06/22 0803) Weight:  [99.8 kg (220 lb 0.3 oz)] 99.8 kg (220 lb 0.3 oz) (06/22 0100)  Intake/Output from previous day: 06/21 0701 - 06/22 0700 In: 1136.3 [P.O.:480; I.V.:656.3] Out: 3375 [Urine:3375]  Physical Exam: General appearance: alert, cooperative, appears stated age, no distress and mildly obese Lungs: clear to auscultation bilaterally Chest wall: no tenderness Heart: regular rate and rhythm, S1, S2 normal, no murmur, click, rub or gallop Abdomen: soft, non-tender; bowel sounds normal; no masses, no organomegaly Extremities: extremities normal, atraumatic, no cyanosis or edema Pulses: Carotids 2+ without any bruit, left femoral pulse 1-2+, right femoral pulse 2+. Popliteal pulse 2+ bilaterally, pedal pulses faint bilaterally. Capillary refill normal. Right radial access site asymptomatic. Neurologic: Grossly normal  Lab Results: BMP  Recent Labs  09/12/14 1847  NA 138  K 4.1  CL 106  CO2 25  GLUCOSE 127*  BUN 16  CREATININE 1.01  CALCIUM 9.4  GFRNONAA >60  GFRAA >60    CBC  Recent Labs Lab 09/12/14 1847  WBC 10.2  RBC 4.89  HGB 16.4  HCT 45.9  PLT 191  MCV 93.9  MCH 33.5  MCHC 35.7  RDW 13.7  LYMPHSABS 1.7  MONOABS 0.7  EOSABS 0.3  BASOSABS 0.0    HEMOGLOBIN A1C Lab Results  Component Value Date   HGBA1C 6.5* 09/14/2014   MPG 140 09/14/2014    Cardiac Panel (last 3 results)  Recent Labs  09/12/14 1847 09/13/14 1719  TROPONINI <0.03 <0.03    BNP (last 3 results) No results for input(s): PROBNP in the last 8760 hours.  TSH  Recent Labs  09/14/14 0600  TSH 7.917*    CHOLESTEROL  Recent  Labs  09/14/14 0600  CHOL 151    Hepatic Function Panel  Recent Labs  09/12/14 1847  PROT 6.9  ALBUMIN 3.7  AST 20  ALT 20  ALKPHOS 73  BILITOT 0.4    Imaging: Ct Angio Chest Pe W/cm &/or Wo Cm  09/13/2014   CLINICAL DATA:  Syncope.  Became unresponsive.  Initial encounter.  EXAM: CT ANGIOGRAPHY CHEST WITH CONTRAST  TECHNIQUE: Multidetector CT imaging of the chest was performed using the standard protocol during bolus administration of intravenous contrast. Multiplanar CT image reconstructions and MIPs were obtained to evaluate the vascular anatomy.  CONTRAST:  80mL OMNIPAQUE IOHEXOL 350 MG/ML SOLN  COMPARISON:  Chest radiograph performed 09/12/2014  FINDINGS: There is no evidence of pulmonary embolus.  Bibasilar atelectasis or scarring is noted. The lungs are otherwise clear. There is no evidence of significant focal consolidation, pleural effusion or pneumothorax. No masses are identified; no abnormal focal contrast enhancement is seen.  Mild scattered coronary artery calcification is noted. The mediastinum is otherwise unremarkable. Visualized mediastinal nodes remain normal in size. The great vessels are grossly unremarkable in appearance. No pericardial effusion is identified. No axillary lymphadenopathy is seen. The visualized portions of the thyroid gland are unremarkable in appearance.  The visualized portions of the liver and spleen are unremarkable.  No acute osseous abnormalities are seen.  Review of the MIP images confirms the above findings.  IMPRESSION: 1. No evidence of pulmonary embolus.  2. Bibasilar atelectasis or scarring noted.  Lungs otherwise clear. 3. Mild scattered coronary artery calcifications seen.   Electronically Signed   By: Roanna Raider M.D.   On: 09/13/2014 22:11    Cardiac Studies:  EKG: 09/12/2014: Normal sinus rhythm, normal axis. Nonspecific T inversions in the inferior leads, T wave inversion in anterolateral leads, borderline QT prolongation, cannot  exclude anterolateral ischemia.  Assessment/Plan:  1. Syncope, cannot exclude Cardiogenic syncope. No premonitory symptoms.  2. Abnormal EKG, cannot exclude anterolateral ischemia. 3. History of tobacco use disorder 4. Hyperglycemia  Recommendation: coronary angiogram revealed only mild disease. Will place Event Monitor for further evaluation of arrhythmia and follow up outpatient.   Erling Conte, NP-C 09/15/2014, 8:41 AM [facility] Pager: [phone] Office: [phone]

## 2015-09-22 ENCOUNTER — Encounter: Payer: Self-pay | Admitting: Internal Medicine

## 2016-05-24 ENCOUNTER — Encounter (HOSPITAL_COMMUNITY): Payer: Self-pay | Admitting: *Deleted

## 2016-05-24 ENCOUNTER — Emergency Department (HOSPITAL_COMMUNITY)
Admission: EM | Admit: 2016-05-24 | Discharge: 2016-05-24 | Disposition: A | Discharge status: Home / Self Care | Payer: Medicare Other | Attending: Emergency Medicine | Admitting: Emergency Medicine

## 2016-05-24 DIAGNOSIS — J449 Chronic obstructive pulmonary disease, unspecified: Secondary | ICD-10-CM | POA: Insufficient documentation

## 2016-05-24 DIAGNOSIS — L0501 Pilonidal cyst with abscess: Secondary | ICD-10-CM | POA: Insufficient documentation

## 2016-05-24 DIAGNOSIS — F17200 Nicotine dependence, unspecified, uncomplicated: Secondary | ICD-10-CM | POA: Diagnosis not present

## 2016-05-24 DIAGNOSIS — E119 Type 2 diabetes mellitus without complications: Secondary | ICD-10-CM | POA: Diagnosis not present

## 2016-05-24 MED ORDER — IPRATROPIUM-ALBUTEROL 0.5-2.5 (3) MG/3ML IN SOLN
6.0000 mL | Freq: Once | RESPIRATORY_TRACT | Status: AC
  Administered 2016-05-24: 6 mL via RESPIRATORY_TRACT
  Filled 2016-05-24: qty 6

## 2016-05-24 MED ORDER — SULFAMETHOXAZOLE-TRIMETHOPRIM 800-160 MG PO TABS
1.0000 | ORAL_TABLET | Freq: Once | ORAL | Status: AC
  Administered 2016-05-24: 1 via ORAL
  Filled 2016-05-24: qty 1

## 2016-05-24 MED ORDER — ALBUTEROL SULFATE HFA 108 (90 BASE) MCG/ACT IN AERS
2.0000 | INHALATION_SPRAY | Freq: Once | RESPIRATORY_TRACT | Status: AC
  Administered 2016-05-24: 2 via RESPIRATORY_TRACT
  Filled 2016-05-24: qty 6.7

## 2016-05-24 MED ORDER — LIDOCAINE HCL (PF) 1 % IJ SOLN
5.0000 mL | Freq: Once | INTRAMUSCULAR | Status: AC
  Administered 2016-05-24: 5 mL via INTRADERMAL
  Filled 2016-05-24: qty 5

## 2016-05-24 MED ORDER — SULFAMETHOXAZOLE-TRIMETHOPRIM 800-160 MG PO TABS: 2.0000 | ORAL_TABLET | Freq: Two times a day (BID) | ORAL | 0 refills | Status: AC

## 2016-05-24 NOTE — ED Provider Notes (Signed)
  Physical Exam  BP 118/82   Pulse 96   Temp 98.3 F (36.8 C) (Oral)   Resp 18   SpO2 99%   Physical Exam  ED Course  .Marland Kitchen.Incision and Drainage Date/Time: 05/24/2016 5:37 AM Performed by: Wynetta EmeryPISCIOTTA, Perley Arthurs Authorized by: Wynetta EmeryPISCIOTTA, Jannelle Notaro   Consent:    Consent obtained:  Verbal   Consent given by:  Patient   Alternatives discussed:  No treatment Location:    Type:  Pilonidal cyst   Size:  5cm Pre-procedure details:    Skin preparation:  Chloraprep Anesthesia (see MAR for exact dosages):    Anesthesia method:  Local infiltration   Local anesthetic:  Lidocaine 1% w/o epi Procedure type:    Complexity:  Simple Procedure details:    Incision types:  Single straight   Scalpel blade:  11   Wound management:  Probed and deloculated   Drainage:  Bloody   Drainage amount:  Scant   Wound treatment:  Wound left open   Packing materials:  None Post-procedure details:    Patient tolerance of procedure:  Tolerated well, no immediate complications          Wynetta Emeryicole Kahleah Crass, PA-C 05/24/16 [id number]    Tomasita CrumbleAdeleke Oni, MD 05/24/16 [phone]

## 2016-05-24 NOTE — ED Provider Notes (Signed)
[facility]-EMERGENCY DEPT Provider Note   CSN: [id number] Arrival date & time: 05/24/16  0446     History   Chief Complaint Chief Complaint  Patient presents with  . Abscess    HPI Jim Peterson is a 51 y.o. male PMH of COPD, DM, pilonidal abscess (25 years ago), here with a repeat abscess. He has had pain for 2 days and can no longer sit without pain.  He denies any fevers or systemic symptoms.  He also states he has COPD but it is at its normal baseline.  He has cough and wheezing but states it is at his normal level. He is requesting albuterol inhaler to go home with.   10 Systems reviewed and are negative for acute change except as noted in the HPI.   HPI  Past Medical History:  Diagnosis Date  . COPD (chronic obstructive pulmonary disease) (HCC)   . Depression   . Diabetes mellitus without complication (HCC)   . Hyperglycemia H/O DM, lost 60 Lbs 2016   Just has hyperglycemia  . Sleep apnea   . Tobacco abuse     Patient Active Problem List   Diagnosis Date Noted  . Syncopal episodes   . Positive D dimer   . Orthostatic hypotension   . Smoker   . Syncope 09/12/2014    Past Surgical History:  Procedure Laterality Date  . CARDIAC CATHETERIZATION N/A 09/14/2014   Procedure: Left Heart Cath and Coronary Angiography;  Surgeon: Yates Decamp, MD;  Location: [facility] INVASIVE CV LAB;  Service: Cardiovascular;  Laterality: N/A;  . HAND SURGERY Right   . PILONIDAL CYST EXCISION         Home Medications    Prior to Admission medications   Medication Sig Start Date End Date Taking? Authorizing Provider  sulfamethoxazole-trimethoprim (BACTRIM DS) 800-160 MG tablet Take 2 tablets by mouth 2 (two) times daily. 05/24/16   Joni Reining Pisciotta, PA-C    Family History No family history on file.  Social History Social History  Substance Use Topics  . Smoking status: Current Some Day Smoker    Packs/day: 0.00    Years: 41.00  . Smokeless tobacco: Former Neurosurgeon  . Alcohol use No      Allergies   Asa [aspirin]; Trazodone and nefazodone; and Wellbutrin [bupropion]   Review of Systems Review of Systems   Physical Exam Updated Vital Signs BP 118/82   Pulse 96   Temp 98.3 F (36.8 C) (Oral)   Resp 18   SpO2 99%   Physical Exam  Constitutional: He is oriented to person, place, and time. Vital signs are normal. He appears well-developed and well-nourished.  Non-toxic appearance. He does not appear ill. No distress.  HENT:  Head: Normocephalic and atraumatic.  Nose: Nose normal.  Mouth/Throat: Oropharynx is clear and moist. No oropharyngeal exudate.  Eyes: Conjunctivae and EOM are normal. Pupils are equal, round, and reactive to light. No scleral icterus.  Neck: Normal range of motion. Neck supple. No tracheal deviation, no edema, no erythema and normal range of motion present. No thyroid mass and no thyromegaly present.  Cardiovascular: Normal rate, regular rhythm, S1 normal, S2 normal, normal heart sounds, intact distal pulses and normal pulses.  Exam reveals no gallop and no friction rub.   No murmur heard. Pulmonary/Chest: Effort normal. No respiratory distress. He has wheezes. He has no rhonchi. He has no rales.  Intermittent wheezing  Abdominal: Soft. Normal appearance and bowel sounds are normal. He exhibits no distension, no ascites and no  mass. There is no hepatosplenomegaly. There is no tenderness. There is no rebound, no guarding and no CVA tenderness.  Genitourinary:  Genitourinary Comments: TTP in the rectal area, induration as well. Consistent with pilonidal abscess  Musculoskeletal: Normal range of motion. He exhibits no edema or tenderness.  Lymphadenopathy:    He has no cervical adenopathy.  Neurological: He is alert and oriented to person, place, and time. He has normal strength. No cranial nerve deficit or sensory deficit.  Skin: Skin is warm, dry and intact. No petechiae and no rash noted. He is not diaphoretic. No erythema. No pallor.   Nursing note and vitals reviewed.    ED Treatments / Results  Labs (all labs ordered are listed, but only abnormal results are displayed) Labs Reviewed - No data to display  EKG  EKG Interpretation None       Radiology No results found.  Procedures Procedures (including critical care time)  Medications Ordered in ED Medications  lidocaine (PF) (XYLOCAINE) 1 % injection 5 mL (5 mLs Intradermal Given 05/24/16 0550)  ipratropium-albuterol (DUONEB) 0.5-2.5 (3) MG/3ML nebulizer solution 6 mL (6 mLs Nebulization Given 05/24/16 0605)  albuterol (PROVENTIL HFA;VENTOLIN HFA) 108 (90 Base) MCG/ACT inhaler 2 puff (2 puffs Inhalation Given 05/24/16 0605)  sulfamethoxazole-trimethoprim (BACTRIM DS,SEPTRA DS) 800-160 MG per tablet 1 tablet (1 tablet Oral Given 05/24/16 0605)     Initial Impression / Assessment and Plan / ED Course  I have reviewed the triage vital signs and the nursing notes.  Pertinent labs & imaging results that were available during my care of the patient were reviewed by me and considered in my medical decision making (see chart for details).       Patient presents to the ED for abscess.  I&D was performed.  He was given breathing treatment and albuterol inhaler to go home with.  PCP fu advised.  Given surgical fu for wound as well.  DC home with bactrim. He appears well and in NAD. VS remain within his normal limits and he is safe for DC.  Final Clinical Impressions(s) / ED Diagnoses   Final diagnoses:  Pilonidal abscess    New Prescriptions New Prescriptions   SULFAMETHOXAZOLE-TRIMETHOPRIM (BACTRIM DS) 800-160 MG TABLET    Take 2 tablets by mouth 2 (two) times daily.     Tomasita CrumbleAdeleke Keanu Lesniak, MD 05/24/16 [phone]

## 2016-05-24 NOTE — ED Triage Notes (Addendum)
Pt c/o abscess to buttock since yesterday. Pt has had an abscess surgically drained from rectum 26 years ago. Also reports feeling wheezy. Hx of COPD and DM

## 2016-05-24 NOTE — ED Notes (Signed)
Pt comfortable with discharge and follow up instructions. Pt declines wheelchair, escorted to waiting area by this RN. Rx x1 

## 2017-05-08 ENCOUNTER — Inpatient Hospital Stay (HOSPITAL_COMMUNITY)
Admission: EM | Admit: 2017-05-08 | Discharge: 2017-05-24 | DRG: 917 | Disposition: E | Payer: Medicare Other | Attending: Pulmonary Disease | Admitting: Pulmonary Disease

## 2017-05-08 ENCOUNTER — Emergency Department (HOSPITAL_COMMUNITY): Payer: Medicare Other

## 2017-05-08 ENCOUNTER — Encounter (HOSPITAL_COMMUNITY): Payer: Self-pay | Admitting: Emergency Medicine

## 2017-05-08 DIAGNOSIS — R402212 Coma scale, best verbal response, none, at arrival to emergency department: Secondary | ICD-10-CM | POA: Diagnosis present

## 2017-05-08 DIAGNOSIS — E11649 Type 2 diabetes mellitus with hypoglycemia without coma: Secondary | ICD-10-CM | POA: Diagnosis not present

## 2017-05-08 DIAGNOSIS — J9811 Atelectasis: Secondary | ICD-10-CM | POA: Diagnosis not present

## 2017-05-08 DIAGNOSIS — Z87891 Personal history of nicotine dependence: Secondary | ICD-10-CM

## 2017-05-08 DIAGNOSIS — E876 Hypokalemia: Secondary | ICD-10-CM | POA: Diagnosis present

## 2017-05-08 DIAGNOSIS — J9602 Acute respiratory failure with hypercapnia: Secondary | ICD-10-CM | POA: Diagnosis present

## 2017-05-08 DIAGNOSIS — T40601A Poisoning by unspecified narcotics, accidental (unintentional), initial encounter: Secondary | ICD-10-CM | POA: Diagnosis present

## 2017-05-08 DIAGNOSIS — J449 Chronic obstructive pulmonary disease, unspecified: Secondary | ICD-10-CM | POA: Diagnosis present

## 2017-05-08 DIAGNOSIS — J690 Pneumonitis due to inhalation of food and vomit: Secondary | ICD-10-CM | POA: Diagnosis not present

## 2017-05-08 DIAGNOSIS — F329 Major depressive disorder, single episode, unspecified: Secondary | ICD-10-CM | POA: Diagnosis present

## 2017-05-08 DIAGNOSIS — J9601 Acute respiratory failure with hypoxia: Secondary | ICD-10-CM | POA: Diagnosis present

## 2017-05-08 DIAGNOSIS — Z66 Do not resuscitate: Secondary | ICD-10-CM | POA: Diagnosis not present

## 2017-05-08 DIAGNOSIS — I959 Hypotension, unspecified: Secondary | ICD-10-CM | POA: Diagnosis not present

## 2017-05-08 DIAGNOSIS — G931 Anoxic brain damage, not elsewhere classified: Secondary | ICD-10-CM | POA: Diagnosis present

## 2017-05-08 DIAGNOSIS — J9690 Respiratory failure, unspecified, unspecified whether with hypoxia or hypercapnia: Secondary | ICD-10-CM

## 2017-05-08 DIAGNOSIS — N179 Acute kidney failure, unspecified: Secondary | ICD-10-CM | POA: Diagnosis present

## 2017-05-08 DIAGNOSIS — Z515 Encounter for palliative care: Secondary | ICD-10-CM | POA: Diagnosis not present

## 2017-05-08 DIAGNOSIS — R680 Hypothermia, not associated with low environmental temperature: Secondary | ICD-10-CM | POA: Diagnosis present

## 2017-05-08 DIAGNOSIS — E874 Mixed disorder of acid-base balance: Secondary | ICD-10-CM | POA: Diagnosis present

## 2017-05-08 DIAGNOSIS — Z885 Allergy status to narcotic agent status: Secondary | ICD-10-CM | POA: Diagnosis not present

## 2017-05-08 DIAGNOSIS — R402312 Coma scale, best motor response, none, at arrival to emergency department: Secondary | ICD-10-CM | POA: Diagnosis present

## 2017-05-08 DIAGNOSIS — E870 Hyperosmolality and hypernatremia: Secondary | ICD-10-CM | POA: Diagnosis not present

## 2017-05-08 DIAGNOSIS — I469 Cardiac arrest, cause unspecified: Secondary | ICD-10-CM

## 2017-05-08 DIAGNOSIS — G253 Myoclonus: Secondary | ICD-10-CM | POA: Diagnosis not present

## 2017-05-08 DIAGNOSIS — I160 Hypertensive urgency: Secondary | ICD-10-CM | POA: Diagnosis present

## 2017-05-08 DIAGNOSIS — Z886 Allergy status to analgesic agent status: Secondary | ICD-10-CM

## 2017-05-08 DIAGNOSIS — Z7189 Other specified counseling: Secondary | ICD-10-CM

## 2017-05-08 DIAGNOSIS — Z888 Allergy status to other drugs, medicaments and biological substances status: Secondary | ICD-10-CM | POA: Diagnosis not present

## 2017-05-08 DIAGNOSIS — G4733 Obstructive sleep apnea (adult) (pediatric): Secondary | ICD-10-CM | POA: Diagnosis present

## 2017-05-08 DIAGNOSIS — R402112 Coma scale, eyes open, never, at arrival to emergency department: Secondary | ICD-10-CM | POA: Diagnosis present

## 2017-05-08 DIAGNOSIS — F1910 Other psychoactive substance abuse, uncomplicated: Secondary | ICD-10-CM | POA: Diagnosis present

## 2017-05-08 DIAGNOSIS — G936 Cerebral edema: Secondary | ICD-10-CM | POA: Diagnosis present

## 2017-05-08 LAB — CBC WITH DIFFERENTIAL/PLATELET
BASOS ABS: 0 10*3/uL (ref 0.0–0.1)
Basophils Relative: 0 %
EOS ABS: 0.3 10*3/uL (ref 0.0–0.7)
Eosinophils Relative: 2 %
HCT: 47.8 % (ref 39.0–52.0)
Hemoglobin: 15.8 g/dL (ref 13.0–17.0)
Lymphocytes Relative: 55 %
Lymphs Abs: 7.1 10*3/uL — ABNORMAL HIGH (ref 0.7–4.0)
MCH: 35 pg — AB (ref 26.0–34.0)
MCHC: 33.1 g/dL (ref 30.0–36.0)
MCV: 106 fL — ABNORMAL HIGH (ref 78.0–100.0)
Monocytes Absolute: 0.8 10*3/uL (ref 0.1–1.0)
Monocytes Relative: 6 %
NEUTROS PCT: 37 %
Neutro Abs: 4.8 10*3/uL (ref 1.7–7.7)
PLATELETS: 199 10*3/uL (ref 150–400)
RBC: 4.51 MIL/uL (ref 4.22–5.81)
RDW: 14 % (ref 11.5–15.5)
WBC: 13 10*3/uL — AB (ref 4.0–10.5)

## 2017-05-08 LAB — URINALYSIS, COMPLETE (UACMP) WITH MICROSCOPIC
Bilirubin Urine: NEGATIVE
Glucose, UA: >=500 mg/dL — AB
KETONES UR: NEGATIVE mg/dL
Leukocytes, UA: NEGATIVE
Nitrite: NEGATIVE
PH: 7 (ref 5.0–8.0)
Protein, ur: 100 mg/dL — AB
Specific Gravity, Urine: 1.008 (ref 1.005–1.030)

## 2017-05-08 LAB — I-STAT ARTERIAL BLOOD GAS, ED
ACID-BASE DEFICIT: 14 mmol/L — AB (ref 0.0–2.0)
Acid-base deficit: 20 mmol/L — ABNORMAL HIGH (ref 0.0–2.0)
BICARBONATE: 15.6 mmol/L — AB (ref 20.0–28.0)
BICARBONATE: 13.7 mmol/L — AB (ref 20.0–28.0)
O2 Saturation: 91 %
O2 Saturation: 99 %
PCO2 ART: 84.6 mmHg — AB (ref 32.0–48.0)
PO2 ART: 151 mmHg — AB (ref 83.0–108.0)
TCO2: 18 mmol/L — AB (ref 22–32)
TCO2: 15 mmol/L — AB (ref 22–32)
pCO2 arterial: 38.8 mmHg (ref 32.0–48.0)
pH, Arterial: 6.874 — CL (ref 7.350–7.450)
pH, Arterial: 7.156 — CL (ref 7.350–7.450)
pO2, Arterial: 107 mmHg (ref 83.0–108.0)

## 2017-05-08 LAB — RAPID URINE DRUG SCREEN, HOSP PERFORMED
Amphetamines: NOT DETECTED
BARBITURATES: NOT DETECTED
BENZODIAZEPINES: NOT DETECTED
COCAINE: NOT DETECTED
Opiates: POSITIVE — AB
Tetrahydrocannabinol: POSITIVE — AB

## 2017-05-08 LAB — POCT I-STAT 3, ART BLOOD GAS (G3+)
Acid-base deficit: 15 mmol/L — ABNORMAL HIGH (ref 0.0–2.0)
Bicarbonate: 14 mmol/L — ABNORMAL LOW (ref 20.0–28.0)
O2 Saturation: 95 %
PCO2 ART: 44.5 mmHg (ref 32.0–48.0)
PH ART: 7.112 — AB (ref 7.350–7.450)
Patient temperature: 38.2
TCO2: 15 mmol/L — AB (ref 22–32)
pO2, Arterial: 108 mmHg (ref 83.0–108.0)

## 2017-05-08 LAB — COMPREHENSIVE METABOLIC PANEL
ALK PHOS: 71 U/L (ref 38–126)
ALT: 46 U/L (ref 17–63)
ANION GAP: 22 — AB (ref 5–15)
AST: 74 U/L — ABNORMAL HIGH (ref 15–41)
Albumin: 3.3 g/dL — ABNORMAL LOW (ref 3.5–5.0)
BUN: 21 mg/dL — ABNORMAL HIGH (ref 6–20)
CALCIUM: 9.1 mg/dL (ref 8.9–10.3)
CO2: 16 mmol/L — ABNORMAL LOW (ref 22–32)
CREATININE: 2.13 mg/dL — AB (ref 0.61–1.24)
Chloride: 104 mmol/L (ref 101–111)
GFR, EST AFRICAN AMERICAN: 39 mL/min — AB (ref >60)
GFR, EST NON AFRICAN AMERICAN: 34 mL/min — AB (ref >60)
Glucose, Bld: 352 mg/dL — ABNORMAL HIGH (ref 65–99)
Potassium: 3.4 mmol/L — ABNORMAL LOW (ref 3.5–5.1)
Sodium: 142 mmol/L (ref 135–145)
TOTAL PROTEIN: 5.9 g/dL — AB (ref 6.5–8.1)
Total Bilirubin: 0.6 mg/dL (ref 0.3–1.2)

## 2017-05-08 LAB — I-STAT TROPONIN, ED: Troponin i, poc: 0 ng/mL (ref 0.00–0.08)

## 2017-05-08 LAB — PROCALCITONIN: PROCALCITONIN: 4.48 ng/mL

## 2017-05-08 LAB — MRSA PCR SCREENING: MRSA BY PCR: NEGATIVE

## 2017-05-08 LAB — ETHANOL: Alcohol, Ethyl (B): <10 mg/dL (ref <10)

## 2017-05-08 LAB — CBG MONITORING, ED: Glucose-Capillary: 285 mg/dL — ABNORMAL HIGH (ref 65–99)

## 2017-05-08 LAB — LACTIC ACID, PLASMA: Lactic Acid, Venous: 5.8 mmol/L (ref 0.5–1.9)

## 2017-05-08 LAB — I-STAT CG4 LACTIC ACID, ED: Lactic Acid, Venous: 13.97 mmol/L (ref 0.5–1.9)

## 2017-05-08 LAB — PROTIME-INR
INR: 1.2
PROTHROMBIN TIME: 15.1 s (ref 11.4–15.2)

## 2017-05-08 LAB — APTT: APTT: 52 s — AB (ref 24–36)

## 2017-05-08 LAB — TROPONIN I: TROPONIN I: 0.9 ng/mL — AB (ref <0.03)

## 2017-05-08 MED ORDER — SODIUM BICARBONATE 8.4 % IV SOLN
200.0000 meq | Freq: Once | INTRAVENOUS | Status: AC
  Administered 2017-05-08: 200 meq via INTRAVENOUS
  Filled 2017-05-08: qty 200

## 2017-05-08 MED ORDER — FENTANYL CITRATE (PF) 100 MCG/2ML IJ SOLN
50.0000 ug | Freq: Once | INTRAMUSCULAR | Status: AC
  Administered 2017-05-08: 50 ug via INTRAVENOUS

## 2017-05-08 MED ORDER — HYDRALAZINE HCL 20 MG/ML IJ SOLN: 10.0000 mg | INTRAMUSCULAR | Status: DC | PRN

## 2017-05-08 MED ORDER — STERILE WATER FOR INJECTION IV SOLN
INTRAVENOUS | Status: DC
  Administered 2017-05-08: via INTRAVENOUS
  Filled 2017-05-08 (×2): qty 850

## 2017-05-08 MED ORDER — PROPOFOL 1000 MG/100ML IV EMUL
0.0000 ug/kg/min | INTRAVENOUS | Status: DC
  Administered 2017-05-08 – 2017-05-09 (×2): 10 ug/kg/min via INTRAVENOUS
  Filled 2017-05-08: qty 100

## 2017-05-08 MED ORDER — SODIUM CHLORIDE 0.9 % IV SOLN: INTRAVENOUS | Status: DC

## 2017-05-08 MED ORDER — SODIUM CHLORIDE 0.9 % IV SOLN
INTRAVENOUS | Status: DC
  Administered 2017-05-08: 16:00:00 via INTRAVENOUS

## 2017-05-08 MED ORDER — POTASSIUM CHLORIDE 20 MEQ/15ML (10%) PO SOLN
40.0000 meq | Freq: Once | ORAL | Status: AC
  Administered 2017-05-08: 40 meq
  Filled 2017-05-08: qty 30

## 2017-05-08 MED ORDER — INSULIN ASPART 100 UNIT/ML ~~LOC~~ SOLN
0.0000 [IU] | SUBCUTANEOUS | Status: DC
  Administered 2017-05-08: 8 [IU] via SUBCUTANEOUS
  Administered 2017-05-08: 5 [IU] via SUBCUTANEOUS
  Administered 2017-05-08: 8 [IU] via SUBCUTANEOUS
  Administered 2017-05-09: 2 [IU] via SUBCUTANEOUS
  Administered 2017-05-09: 3 [IU] via SUBCUTANEOUS
  Administered 2017-05-09: 2 [IU] via SUBCUTANEOUS
  Administered 2017-05-09: 5 [IU] via SUBCUTANEOUS
  Administered 2017-05-09 – 2017-05-10 (×4): 3 [IU] via SUBCUTANEOUS
  Administered 2017-05-10: 5 [IU] via SUBCUTANEOUS
  Administered 2017-05-11: 3 [IU] via SUBCUTANEOUS
  Administered 2017-05-11: 5 [IU] via SUBCUTANEOUS
  Administered 2017-05-11 (×2): 8 [IU] via SUBCUTANEOUS
  Administered 2017-05-12 (×5): 3 [IU] via SUBCUTANEOUS
  Filled 2017-05-08: qty 1

## 2017-05-08 MED ORDER — CHLORHEXIDINE GLUCONATE 0.12% ORAL RINSE (MEDLINE KIT)
15.0000 mL | Freq: Two times a day (BID) | OROMUCOSAL | Status: DC
  Administered 2017-05-08 – 2017-05-12 (×8): 15 mL via OROMUCOSAL

## 2017-05-08 MED ORDER — VANCOMYCIN HCL IN DEXTROSE 1-5 GM/200ML-% IV SOLN: 1000.0000 mg | Freq: Once | INTRAVENOUS | Status: DC

## 2017-05-08 MED ORDER — SODIUM CHLORIDE 0.9 % IV BOLUS (SEPSIS)
1000.0000 mL | Freq: Once | INTRAVENOUS | Status: AC
  Administered 2017-05-08: 1000 mL via INTRAVENOUS

## 2017-05-08 MED ORDER — FENTANYL 2500MCG IN NS 250ML (10MCG/ML) PREMIX INFUSION
25.0000 ug/h | INTRAVENOUS | Status: DC
  Administered 2017-05-08 (×2): 50 ug/h via INTRAVENOUS
  Administered 2017-05-09: 400 ug/h via INTRAVENOUS
  Filled 2017-05-08 (×2): qty 250

## 2017-05-08 MED ORDER — PIPERACILLIN-TAZOBACTAM 3.375 G IVPB
3.3750 g | Freq: Three times a day (TID) | INTRAVENOUS | Status: DC
  Administered 2017-05-08 – 2017-05-12 (×12): 3.375 g via INTRAVENOUS
  Filled 2017-05-08 (×14): qty 50

## 2017-05-08 MED ORDER — FUROSEMIDE 10 MG/ML IJ SOLN
40.0000 mg | Freq: Once | INTRAMUSCULAR | Status: AC
  Administered 2017-05-08: 40 mg via INTRAVENOUS
  Filled 2017-05-08: qty 4

## 2017-05-08 MED ORDER — HEPARIN SODIUM (PORCINE) 5000 UNIT/ML IJ SOLN
5000.0000 [IU] | Freq: Three times a day (TID) | INTRAMUSCULAR | Status: DC
  Administered 2017-05-08 – 2017-05-12 (×12): 5000 [IU] via SUBCUTANEOUS
  Filled 2017-05-08 (×12): qty 1

## 2017-05-08 MED ORDER — EPINEPHRINE PF 1 MG/10ML IJ SOSY
PREFILLED_SYRINGE | INTRAMUSCULAR | Status: AC | PRN
  Administered 2017-05-08 (×2): 0.5 mg via INTRAVENOUS

## 2017-05-08 MED ORDER — FENTANYL CITRATE (PF) 100 MCG/2ML IJ SOLN: 100.0000 ug | INTRAMUSCULAR | Status: DC | PRN

## 2017-05-08 MED ORDER — PANTOPRAZOLE SODIUM 40 MG IV SOLR
40.0000 mg | Freq: Every day | INTRAVENOUS | Status: DC
  Administered 2017-05-08 – 2017-05-11 (×4): 40 mg via INTRAVENOUS
  Filled 2017-05-08 (×4): qty 40

## 2017-05-08 MED ORDER — SODIUM CHLORIDE 0.9 % IV SOLN: 250.0000 mL | INTRAVENOUS | Status: DC | PRN

## 2017-05-08 MED ORDER — VANCOMYCIN HCL 10 G IV SOLR
2000.0000 mg | Freq: Once | INTRAVENOUS | Status: AC
  Administered 2017-05-08: 2000 mg via INTRAVENOUS
  Filled 2017-05-08: qty 2000

## 2017-05-08 MED ORDER — LABETALOL HCL 5 MG/ML IV SOLN
20.0000 mg | INTRAVENOUS | Status: DC | PRN
  Administered 2017-05-08: 10 mg via INTRAVENOUS
  Administered 2017-05-09 – 2017-05-10 (×3): 20 mg via INTRAVENOUS
  Filled 2017-05-08 (×4): qty 4

## 2017-05-08 MED ORDER — FENTANYL CITRATE (PF) 100 MCG/2ML IJ SOLN
INTRAMUSCULAR | Status: AC
  Filled 2017-05-08: qty 2

## 2017-05-08 MED ORDER — ASPIRIN 300 MG RE SUPP
300.0000 mg | RECTAL | Status: AC
  Filled 2017-05-08: qty 1

## 2017-05-08 MED ORDER — PIPERACILLIN-TAZOBACTAM 3.375 G IVPB 30 MIN
3.3750 g | Freq: Once | INTRAVENOUS | Status: AC
  Administered 2017-05-08: 3.375 g via INTRAVENOUS
  Filled 2017-05-08: qty 50

## 2017-05-08 MED ORDER — FENTANYL BOLUS VIA INFUSION: 50.0000 ug | INTRAVENOUS | Status: DC | PRN

## 2017-05-08 MED ORDER — FENTANYL CITRATE (PF) 100 MCG/2ML IJ SOLN
25.0000 ug | INTRAMUSCULAR | Status: DC | PRN
  Administered 2017-05-10 (×3): 100 ug via INTRAVENOUS
  Administered 2017-05-10: 50 ug via INTRAVENOUS
  Administered 2017-05-10 – 2017-05-11 (×4): 100 ug via INTRAVENOUS
  Filled 2017-05-08 (×8): qty 2

## 2017-05-08 MED ORDER — ACETAMINOPHEN 160 MG/5ML PO SOLN
650.0000 mg | Freq: Four times a day (QID) | ORAL | Status: DC | PRN
  Administered 2017-05-08 – 2017-05-11 (×3): 650 mg
  Filled 2017-05-08 (×3): qty 20.3

## 2017-05-08 MED ORDER — VANCOMYCIN HCL 10 G IV SOLR
1250.0000 mg | INTRAVENOUS | Status: DC
  Filled 2017-05-08: qty 1250

## 2017-05-08 MED ORDER — ORAL CARE MOUTH RINSE
15.0000 mL | OROMUCOSAL | Status: DC
  Administered 2017-05-08 – 2017-05-12 (×35): 15 mL via OROMUCOSAL

## 2017-05-08 MED ORDER — SODIUM BICARBONATE 8.4 % IV SOLN
INTRAVENOUS | Status: AC | PRN
  Administered 2017-05-08: 100 meq via INTRAVENOUS

## 2017-05-08 NOTE — ED Notes (Signed)
This rn remains at bedside 

## 2017-05-08 NOTE — ED Notes (Signed)
Dr Wallace CullensGray at bedside placing arterial line

## 2017-05-08 NOTE — Progress Notes (Signed)
eLink Physician-Brief Progress Note Patient Name: Jim BoardRoger Peterson DOB: Nov 01, 1965 MRN: [id number]   Date of Service  2017-06-13  HPI/Events of Note  Ongoing metabolic acidosis with mild resp acidosis.  PH 7.11/44.5/108/14  eICU Interventions  Plan Increaser RR on vent to 35 4 amps of bicarb IVP Bicarb gtt Recheck ABG in AM     Intervention Category Major Interventions: Acid-Base disturbance - evaluation and management  DETERDING,ELIZABETH 2017-06-13, 11:18 PM

## 2017-05-08 NOTE — Code Documentation (Signed)
Norepinephrine initiated

## 2017-05-08 NOTE — ED Provider Notes (Signed)
[facility] EMERGENCY DEPARTMENT Provider Note   CSN: [id number] Arrival date & time: 05/23/2017  1559   5 caveat: Altered mental status/ Cardiac arrest  History   Chief Complaint CPR  HPI Jim Peterson is a 52 y.o. male.  HPI Patient presented to the emergency room after being found unresponsive by friends.  Patient has a history of substance abuse.  Unknown downtime but approximately 30 minutes or so.  Pt's friends administered narcan prior to EMS arrival. EMS found the patient unresponsive in asystole.  They initiated CPR, fluid resuscitation and intubated the patient.   They had return of spontaneous circulation. History reviewed. No pertinent past medical history.  Patient Active Problem List   Diagnosis Date Noted  . Cardiac arrest (HCC) 05/17/2017    History reviewed. No pertinent surgical history. unknown    Home Medications    Prior to Admission medications   Not on File  unkown  Family History History reviewed. No pertinent family history.  Social History Social History   Tobacco Use  . Smoking status: Unknown If Ever Smoked  Substance Use Topics  . Alcohol use: Not on file    Comment: unknown  . Drug use: Yes     Allergies   Asa [aspirin]; Trazodone and nefazodone; and Wellbutrin [bupropion]   Review of Systems Review of Systems  Unable to perform ROS: Mental status change     Physical Exam Updated Vital Signs BP (!) 156/119   Pulse (!) 117   Temp 97.7 F (36.5 C) (Tympanic)   Resp 15   Ht 1.778 m (5\' 10" )   Wt 95 kg (209 lb 7 oz)   SpO2 100%   BMI 30.05 kg/m   Physical Exam  Constitutional: He appears well-developed and well-nourished. No distress.  HENT:  Head: Normocephalic and atraumatic.  Right Ear: External ear normal.  Left Ear: External ear normal.  Questionable dried emesis on the face, ET tube in place  Eyes: Conjunctivae are normal. Right eye exhibits no discharge. Left eye exhibits no discharge.  No scleral icterus.  Neck: Neck supple. No tracheal deviation present.  Cardiovascular: Normal rate, regular rhythm and intact distal pulses.  Pulmonary/Chest: Effort normal and breath sounds normal. No stridor. No respiratory distress. He has no wheezes. He has no rales.  Abdominal: Soft. Bowel sounds are normal. He exhibits no distension. There is no tenderness. There is no rebound and no guarding.  Musculoskeletal: He exhibits no edema or tenderness.  No spinal step-offs, no evidence of injury or deformity  Neurological: No cranial nerve deficit (No facial droop noted) or sensory deficit. GCS eye subscore is 1. GCS verbal subscore is 1. GCS motor subscore is 1.  Patient is unresponsive to painful or verbal stimuli  Skin: Skin is warm and dry. No rash noted.  Psychiatric: He has a normal mood and affect.  Nursing note and vitals reviewed.    ED Treatments / Results  Labs (all labs ordered are listed, but only abnormal results are displayed) Labs Reviewed  COMPREHENSIVE METABOLIC PANEL - Abnormal; Notable for the following components:      Result Value   Potassium 3.4 (*)    CO2 16 (*)    Glucose, Bld 352 (*)    BUN 21 (*)    Creatinine, Ser 2.13 (*)    Total Protein 5.9 (*)    Albumin 3.3 (*)    AST 74 (*)    GFR calc non Af Amer 34 (*)    GFR  calc Af Amer 39 (*)    Anion gap 22 (*)    All other components within normal limits  CBC WITH DIFFERENTIAL/PLATELET - Abnormal; Notable for the following components:   WBC 13.0 (*)    MCV 106.0 (*)    MCH 35.0 (*)    Lymphs Abs 7.1 (*)    All other components within normal limits  APTT - Abnormal; Notable for the following components:   aPTT 52 (*)    All other components within normal limits  URINALYSIS, COMPLETE (UACMP) WITH MICROSCOPIC - Abnormal; Notable for the following components:   APPearance HAZY (*)    Glucose, UA >=500 (*)    Hgb urine dipstick SMALL (*)    Protein, ur 100 (*)    Bacteria, UA RARE (*)    Squamous  Epithelial / LPF 0-5 (*)    All other components within normal limits  RAPID URINE DRUG SCREEN, HOSP PERFORMED - Abnormal; Notable for the following components:   Opiates POSITIVE (*)    Tetrahydrocannabinol POSITIVE (*)    All other components within normal limits  I-STAT ARTERIAL BLOOD GAS, ED - Abnormal; Notable for the following components:   pH, Arterial 6.874 (*)    pCO2 arterial 84.6 (*)    Bicarbonate 15.6 (*)    TCO2 18 (*)    Acid-base deficit 20.0 (*)    All other components within normal limits  I-STAT CG4 LACTIC ACID, ED - Abnormal; Notable for the following components:   Lactic Acid, Venous 13.97 (*)    All other components within normal limits  CULTURE, BLOOD (ROUTINE X 2)  CULTURE, BLOOD (ROUTINE X 2)  URINE CULTURE  CULTURE, RESPIRATORY (NON-EXPECTORATED)  PROTIME-INR  ETHANOL  HIV ANTIBODY (ROUTINE TESTING)  CBC  BASIC METABOLIC PANEL  BLOOD GAS, ARTERIAL  MAGNESIUM  PHOSPHORUS  LACTIC ACID, PLASMA  LACTIC ACID, PLASMA  BLOOD GAS, ARTERIAL  TROPONIN I  TROPONIN I  PROCALCITONIN  PROCALCITONIN  I-STAT TROPONIN, ED    EKG Sinus tachycardia with a rate of 100 Left posterior fascicular block Right axis deviation Nonspecific ST-T wave changes No prior EKG for comparison  Radiology Ct Head Wo Contrast  Result Date: 05/11/2017 CLINICAL DATA:  52 year old male status post CPR. EXAM: CT HEAD WITHOUT CONTRAST TECHNIQUE: Contiguous axial images were obtained from the base of the skull through the vertex without intravenous contrast. COMPARISON:  Head CT without contrast 09/12/2014 FINDINGS: Brain: No intracranial mass effect or midline shift, and ventricle size and configuration appears stable since 2016. However, there is widespread loss of bilateral cerebral sulci (compare series 3, image 24 today to series 3, image 26 in 2016). There is subtle diminished gray-white matter differentiation in both hemispheres. Basilar cisterns remain patent. No intracranial  hemorrhage identified. No cytotoxic edema is evident. Vascular: Similar generalized intracranial vascular hyperdensity to the 2016 study. Mild Calcified atherosclerosis at the skull base. Skull: Stable and negative. Sinuses/Orbits: Chronic bilateral paranasal sinus mucosal thickening and opacification, mildly progressed compared to 2016. Superimposed fluid in the nasal cavity and visible nasopharynx, the patient is intubated. Tympanic cavities and mastoids remain clear. Other: Stable orbit and scalp soft tissues. IMPRESSION: 1. 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 Brain MRI without contrast would confirm. 2. No intracranial hemorrhage, mass effect, or ventriculomegaly. Electronically Signed   By: Odessa FlemingH  Hall M.D.   On:  05/02/2017 17:27   Dg Chest Portable 1 View  Result Date: 05/11/2017 CLINICAL DATA:  Verify endotracheal tube placement. Status post CPR. EXAM: PORTABLE CHEST 1 VIEW COMPARISON:  None. FINDINGS: Endotracheal tube terminates 6.4 cm above carina.a the endotracheal tube balloon is prominent. Multiple leads and wires project over the chest. External pacer/defibrillator. Apical lordotic positioning. Mild cardiomegaly, accentuated be AP portable technique. Apparent superior mediastinal soft tissue fullness. Left costophrenic angle excluded. No gross pleural fluid or pneumothorax. Low lung volumes with resultant pulmonary interstitial prominence. Possible left suprahilar airspace disease. IMPRESSION: Endotracheal tube terminates 6.4 cm above carina. The balloon is prominent. Consider minimal advancement and deflation of endotracheal balloon. Superior mediastinal soft tissue fullness could be technique related but is nonspecific. Similarly, cannot exclude left suprahilar airspace disease (atelectasis or aspiration given the clinical  history). Electronically Signed   By: Jeronimo Greaves M.D.   On: 05/10/2017 16:20    Procedures .Critical Care Performed by: Linwood Dibbles, MD Authorized by: Linwood Dibbles, MD   Critical care provider statement:    Critical care time (minutes):  45   Critical care was time spent personally by me on the following activities:  Discussions with consultants, evaluation of patient's response to treatment, examination of patient, ordering and performing treatments and interventions, ordering and review of laboratory studies, ordering and review of radiographic studies, pulse oximetry, re-evaluation of patient's condition, obtaining history from patient or surrogate and review of old charts   (including critical care time)  Medications Ordered in ED Medications  aspirin suppository 300 mg (0 mg Rectal Hold 05/03/2017 1735)  propofol (DIPRIVAN) 1000 MG/100ML infusion (20 mcg/kg/min  95 kg Intravenous Rate/Dose Change 05/10/2017 1644)  piperacillin-tazobactam (ZOSYN) IVPB 3.375 g (not administered)  vancomycin (VANCOCIN) 2,000 mg in sodium chloride 0.9 % 500 mL IVPB (2,000 mg Intravenous New Bag/Given 05/18/2017 1715)  fentaNYL in NS (72mcg/ml) infusion-PREMIX (50 mcg/hr Intravenous New Bag/Given 05/17/2017 1725)  heparin injection 5,000 Units (not administered)  pantoprazole (PROTONIX) injection 40 mg (not administered)  0.9 %  sodium chloride infusion (not administered)  0.9 %  sodium chloride infusion (not administered)  fentaNYL (SUBLIMAZE) injection 25-100 mcg (not administered)  labetalol (NORMODYNE,TRANDATE) injection 20 mg (not administered)  hydrALAZINE (APRESOLINE) injection 10-40 mg (not administered)  insulin aspart (novoLOG) injection 0-15 Units (not administered)  furosemide (LASIX) injection 40 mg (not administered)  potassium chloride 20 MEQ/15ML (10%) solution 40 mEq (not administered)  vancomycin (VANCOCIN) 1,250 mg in sodium chloride 0.9 % 250 mL IVPB (not administered)    piperacillin-tazobactam (ZOSYN) IVPB 3.375 g (not administered)  sodium chloride 0.9 % bolus 1,000 mL (0 mLs Intravenous Stopped 05/16/2017 1724)    And  sodium chloride 0.9 % bolus 1,000 mL (0 mLs Intravenous Stopped 05/22/2017 1724)  fentaNYL (SUBLIMAZE) injection 50 mcg (50 mcg Intravenous Bolus 05/13/2017 1725)     Initial Impression / Assessment and Plan / ED Course  I have reviewed the triage vital signs and the nursing notes.  Pertinent labs & imaging results that were available during my care of the patient were reviewed by me and considered in my medical decision making (see chart for details).  Clinical Course as of May 08 1733  Wed May 08, 2017  1629 Discussed with critical care.   Will implement passive cooling measures.  Will continue with evaluation.  ABG shows a resp and possible metabolic acidosis.  Will increase vent rate  [JK]  1635 Cardiology and PCCM consulted per code cool protocol.  No cardiac intervention  planned at this time  [JK]  1635 Profound lactic acidosis.  I suspect this is related to his downtime and not necessarily sepsis but I will cover him with IV antibiotics in the meantime.  [JK]  1708 BP and HR are elevated.  Will start sedation meds  [JK]  1735 CT scan showing findings c/w early anoxic brain injury.    [JK]    Clinical Course User Index [JK] Linwood Dibbles, MD    Patient presented to the emergency room after being found in cardiac arrest.  He was successfully resuscitated in the field.  Patient remained unresponsive in the emergency room.  It was presumed he had a drug overdose.  Patient did have positive opiates and marijuana in his drug screen.  Laboratory tests are notable for acute kidney injury.  He also had a profound respiratory and metabolic acidosis.  Patient's blood pressure and heart rate remained stable, and in fact he was hypertensive and tachycardic.  We did increase his sedative medications.  Pulmonary critical care was consulted.  Patient was  admitted to the hospital for further treatment.  Final Clinical Impressions(s) / ED Diagnoses   Final diagnoses:  Cardiac arrest [facility])      Linwood Dibbles, MD 05/14/2017 [phone]

## 2017-05-08 NOTE — ED Notes (Signed)
pts heart rate elevated at 170, bp 180 systolic, edp notified and advised this rn to start sedation.

## 2017-05-08 NOTE — ED Notes (Signed)
Will hold antibiotics until returned from ct per dr. Lynelle Doctorknapp

## 2017-05-08 NOTE — ED Triage Notes (Signed)
Pt presents with ems post cpr, unresponsive, intubated.  Pt was at a friends house and went into cardiac arrest witnessed by friends down for 15 minutes, given unknown amount of narcan by friends.  On ems arrival pt was in asystole and CPR initiated for 15 min with 3 doses of IV epi bringing pulses back and Sinus Tach rhythm.  Pt is unresponsive with strong pulses being bagged by ems on arrival. Pupils 4mm and sluggish.

## 2017-05-08 NOTE — Progress Notes (Signed)
Pharmacy Antibiotic Note  Jim Peterson is a 52 y.o. male admitted on 11-01-17 with sepsis.  Pharmacy has been consulted for vancomycin and zosyn dosing. AKI noted with sCr 2.13 and CrCl 6047ml/min.  Vancomycin trough goal 15-20  Plan: 1) Vancomycin 2g IV x 1 then 1250mg  IV q24 2) Zosyn 3.375g IV q8 (4 hour infusion) 3) Follow renal function, cultures, LOT, level if needed  Height: 5\' 10"  (177.8 cm) Weight: 209 lb 7 oz (95 kg) IBW/kg (Calculated) : 73  No data recorded.  Recent Labs  Lab 02/14/18 1602 02/14/18 1629  WBC 13.0*  --   CREATININE 2.13*  --   LATICACIDVEN  --  13.97*    Estimated Creatinine Clearance: 46.9 mL/min (A) (by C-G formula based on SCr of 2.13 mg/dL (H)).    Allergies  Allergen Reactions  . Asa [Aspirin]   . Trazodone And Nefazodone   . Wellbutrin [Bupropion]     Antimicrobials this admission: 2/13 Vancomycin >> 2/13 Zosyn >>  Dose adjustments this admission: n/a  Microbiology results: 2/13 blood x2>>  Thank you for allowing pharmacy to be a part of this patient's care.  Jim Peterson, Jim Peterson 11-01-17 5:27 PM

## 2017-05-08 NOTE — Code Documentation (Signed)
Per Dr. Vivianne MasterBenzamone pt now has pulses

## 2017-05-08 NOTE — H&P (Signed)
PULMONARY / CRITICAL CARE MEDICINE   Name: Jim Peterson MRN: [id number] DOB: Jan 19, 1966    ADMISSION DATE:  05/19/2017 CONSULTATION DATE:  2/13  REFERRING MD:  EDP  CHIEF COMPLAINT:  Post arrest   HISTORY OF PRESENT ILLNESS:   52yo male with PMH of tobacco dependence, COPD per report, OSA, DM, depression, substance abuse.  He presented 2/13 after being found unresponsive at a friends house.  He was seen about 15 mins prior to being found.  Upon being found, friend gave him 3 doses of narcan (has hx of substance abuse but reportedly has been clean for 2 - 3 months at least).  After no change with Narcan, EMS was called and upon their arrival, pt found in asystole.  ACLS protocol was initiated and performed for another 15 - 20 minutes prior to ROSC.  He was intubated in the field and brought to ED where he remained unresponsive.  pH was noted to be 6.87, lactic acid 13.97, WBC 13, K 3.4, SCr 2.13. UDS positive for opiates and THC.  Cardiology called in consultation and pt deemed to not be a candidate for any intervention.  PCCM called for admission.  He will not be started on hypothermia protocol given prolonged downtime.  PAST MEDICAL HISTORY :  He  has no past medical history on file.  PAST SURGICAL HISTORY: He  has no past surgical history on file.  Allergies not on file  No current facility-administered medications on file prior to encounter.    No current outpatient medications on file prior to encounter.    FAMILY HISTORY:  His has no family status information on file.    SOCIAL HISTORY: He  reports that he uses drugs.  REVIEW OF SYSTEMS:   Unable to obtain as pt is encephalopathic.  SUBJECTIVE:  On vent, unresponsive.  VITAL SIGNS: Ht 5\' 10"  (1.778 m)   Wt 95 kg (209 lb 7 oz)   SpO2 97%   BMI 30.05 kg/m   HEMODYNAMICS:    VENTILATOR SETTINGS: Vent Mode: PRVC FiO2 (%):  [60 %] 60 % Set Rate:  [20 bmp-30 bmp] 30 bmp Vt Set:  [600 mL] 600 mL PEEP:  [5  cmH20] 5 cmH20 Plateau Pressure:  [18 cmH20] 18 cmH20  INTAKE / OUTPUT: No intake/output data recorded.  PHYSICAL EXAMINATION: General: Adult male, critically ill. Neuro: Unresponsive.  Sedation just started due to hypertension and tachycardia. HEENT: [place]/AT. EOMI, sclerae anicteric. ETT in place. Cardiovascular: RRR, no M/R/G.  Lungs: Respirations even and unlabored.  Coarse bilaterally. Abdomen: BS x 4, soft, NT/ND.  Musculoskeletal: No gross deformities, no edema.  Skin: Intact, warm, no rashes.   LABS:  BMET No results for input(s): NA, K, CL, CO2, BUN, CREATININE, GLUCOSE in the last 168 hours.  Electrolytes No results for input(s): CALCIUM, MG, PHOS in the last 168 hours.  CBC No results for input(s): WBC, HGB, HCT, PLT in the last 168 hours.  Coag's No results for input(s): APTT, INR in the last 168 hours.  Sepsis Markers Recent Labs  Lab 05/07/2017 1629  LATICACIDVEN 13.97*    ABG Recent Labs  Lab 05/06/2017 1617  PHART 6.874*  PCO2ART 84.6*  PO2ART 107.0    Liver Enzymes No results for input(s): AST, ALT, ALKPHOS, BILITOT, ALBUMIN in the last 168 hours.  Cardiac Enzymes No results for input(s): TROPONINI, PROBNP in the last 168 hours.  Glucose No results for input(s): GLUCAP in the last 168 hours.  Imaging Dg Chest Portable 1 View  Result Date: 05/22/2017 CLINICAL DATA:  Verify endotracheal tube placement. Status post CPR. EXAM: PORTABLE CHEST 1 VIEW COMPARISON:  None. FINDINGS: Endotracheal tube terminates 6.4 cm above carina.a the endotracheal tube balloon is prominent. Multiple leads and wires project over the chest. External pacer/defibrillator. Apical lordotic positioning. Mild cardiomegaly, accentuated be AP portable technique. Apparent superior mediastinal soft tissue fullness. Left costophrenic angle excluded. No gross pleural fluid or pneumothorax. Low lung volumes with resultant pulmonary interstitial prominence. Possible left suprahilar  airspace disease. IMPRESSION: Endotracheal tube terminates 6.4 cm above carina. The balloon is prominent. Consider minimal advancement and deflation of endotracheal balloon. Superior mediastinal soft tissue fullness could be technique related but is nonspecific. Similarly, cannot exclude left suprahilar airspace disease (atelectasis or aspiration given the clinical history). Electronically Signed   By: Jeronimo GreavesKyle  Talbot M.D.   On: 05/20/2017 16:20     STUDIES:  CT head 2/13 > Echo 2/13 >  EEG 2/13 >   CULTURES: BC x 2 2/13 > Urine 2/13 > Sputum 2/13 >   ANTIBIOTICS: Vanc 2/13 > Zosyn 2/13 >  SIGNIFICANT EVENTS: 2/13 > admit with asystolic arrest, UDS positive for opiates and THC  LINES/TUBES: ETT 2/13 >  DISCUSSION: 52yo male with hx polysubstance abuse with asystolic arrest with at least 30 mins downtime prior to ROSC. Did not respond to narcan in the field.  UDS positive for UDS and THC.  ASSESSMENT / PLAN:  PULMONARY Acute respiratory failure - due to inability to protect the airway in setting cardiac arrest. Hx OSA, COPD per report, tobacco dependence. P:   Vent support - 8cc/kg. ABG noted, increase rate. Repeat ABG in 1 hour. Bronchial hygiene. Follow CXR.  CARDIOVASCULAR Asystolic arrest - unclear etiology at this point.  ? Drug abuse.  UDS pending. Hypertensive urgency. P:  Poor candidate for hypothermia protocol (also hypothermic on arrival). Cardiology consulted, no interventions planned. Trend troponin.  Assess echo.  Labetalol PRN / Hydralazine PRN.  RENAL Lactic acidosis - presumed due to prolonged downtime.  At this point in time, NOT felt to be due to sepsis. Hypokalemia. P:   NS @ 50. Labs pending.   GASTROINTESTINAL GI prophylaxis. P:   NPO. PPI .  HEMATOLOGIC A:   VTE prophylaxis. P:  SCD's / heparin. CBC in AM.  INFECTIOUS A:   ? Occult infection - no clear source at this time. P:   Continue empiric abx for now (vanc /  zosyn). Follow cultures. Assess PCT - low threshold to stop abx if low.  ENDOCRINE A:   Hx DM.   P:   SSI.  NEUROLOGIC AMS - post asystolic arrest - prolonged downtime, likely anoxic injury. Hx substance abuse - UDS positive for opiates and THC. P:   Sedation:  Propofol gtt / fentanyl gtt / Midazolam PRN. RASS goal: 0 to -1. Daily WUA. Assess EEG.   FAMILY  - Updates: No family available.  - Inter-disciplinary family meet or Palliative Care meeting due by:  05/15/17.  CC time: 40 min.   Rutherford Guysahul Loucinda Croy, [place] - [facility] Pager: [phone]  or [phone] 05/11/2017, 5:17 PM

## 2017-05-08 NOTE — Code Documentation (Signed)
Critical care at bedside  

## 2017-05-09 ENCOUNTER — Inpatient Hospital Stay (HOSPITAL_COMMUNITY): Payer: Medicare Other

## 2017-05-09 DIAGNOSIS — G931 Anoxic brain damage, not elsewhere classified: Secondary | ICD-10-CM

## 2017-05-09 DIAGNOSIS — Z7189 Other specified counseling: Secondary | ICD-10-CM

## 2017-05-09 DIAGNOSIS — I469 Cardiac arrest, cause unspecified: Secondary | ICD-10-CM

## 2017-05-09 DIAGNOSIS — Z515 Encounter for palliative care: Secondary | ICD-10-CM

## 2017-05-09 DIAGNOSIS — J9601 Acute respiratory failure with hypoxia: Secondary | ICD-10-CM

## 2017-05-09 LAB — POCT I-STAT 3, ART BLOOD GAS (G3+)
Acid-base deficit: 5 mmol/L — ABNORMAL HIGH (ref 0.0–2.0)
BICARBONATE: 19.9 mmol/L — AB (ref 20.0–28.0)
O2 Saturation: 95 %
PH ART: 7.359 (ref 7.350–7.450)
TCO2: 21 mmol/L — ABNORMAL LOW (ref 22–32)
pCO2 arterial: 35 mmHg (ref 32.0–48.0)
pO2, Arterial: 75 mmHg — ABNORMAL LOW (ref 83.0–108.0)

## 2017-05-09 LAB — TROPONIN I: Troponin I: 3.93 ng/mL (ref <0.03)

## 2017-05-09 LAB — BLOOD GAS, ARTERIAL
ACID-BASE DEFICIT: 2.6 mmol/L — AB (ref 0.0–2.0)
Bicarbonate: 22.2 mmol/L (ref 20.0–28.0)
FIO2: 100
MECHVT: 600 mL
O2 Saturation: 99.3 %
PEEP: 5 cmH2O
Patient temperature: 98.6
RATE: 38 resp/min
pCO2 arterial: 42.2 mmHg (ref 32.0–48.0)
pH, Arterial: 7.341 — ABNORMAL LOW (ref 7.350–7.450)
pO2, Arterial: 290 mmHg — ABNORMAL HIGH (ref 83.0–108.0)

## 2017-05-09 LAB — BASIC METABOLIC PANEL
Anion gap: 17 — ABNORMAL HIGH (ref 5–15)
BUN: 33 mg/dL — ABNORMAL HIGH (ref 6–20)
CHLORIDE: 111 mmol/L (ref 101–111)
CO2: 19 mmol/L — ABNORMAL LOW (ref 22–32)
Calcium: 7.3 mg/dL — ABNORMAL LOW (ref 8.9–10.3)
Creatinine, Ser: 2.82 mg/dL — ABNORMAL HIGH (ref 0.61–1.24)
GFR calc non Af Amer: 24 mL/min — ABNORMAL LOW (ref >60)
GFR, EST AFRICAN AMERICAN: 28 mL/min — AB (ref >60)
Glucose, Bld: 113 mg/dL — ABNORMAL HIGH (ref 65–99)
POTASSIUM: 4.6 mmol/L (ref 3.5–5.1)
SODIUM: 147 mmol/L — AB (ref 135–145)

## 2017-05-09 LAB — GLUCOSE, CAPILLARY
GLUCOSE-CAPILLARY: 287 mg/dL — AB (ref 65–99)
GLUCOSE-CAPILLARY: 212 mg/dL — AB (ref 65–99)
GLUCOSE-CAPILLARY: 130 mg/dL — AB (ref 65–99)
GLUCOSE-CAPILLARY: 238 mg/dL — AB (ref 65–99)
Glucose-Capillary: 132 mg/dL — ABNORMAL HIGH (ref 65–99)
Glucose-Capillary: 165 mg/dL — ABNORMAL HIGH (ref 65–99)
Glucose-Capillary: 185 mg/dL — ABNORMAL HIGH (ref 65–99)

## 2017-05-09 LAB — CBC
HEMATOCRIT: 56.4 % — AB (ref 39.0–52.0)
HEMOGLOBIN: 20.1 g/dL — AB (ref 13.0–17.0)
MCH: 35.5 pg — ABNORMAL HIGH (ref 26.0–34.0)
MCHC: 35.6 g/dL (ref 30.0–36.0)
MCV: 99.6 fL (ref 78.0–100.0)
Platelets: 210 10*3/uL (ref 150–400)
RBC: 5.66 MIL/uL (ref 4.22–5.81)
RDW: 14.2 % (ref 11.5–15.5)
WBC: 29 10*3/uL — ABNORMAL HIGH (ref 4.0–10.5)

## 2017-05-09 LAB — ECHOCARDIOGRAM COMPLETE
HEIGHTINCHES: 70 in
WEIGHTICAEL: 3756.64 [oz_av]

## 2017-05-09 LAB — PHOSPHORUS: PHOSPHORUS: 2.3 mg/dL — AB (ref 2.5–4.6)

## 2017-05-09 LAB — VANCOMYCIN, RANDOM: VANCOMYCIN RM: 13

## 2017-05-09 LAB — PROCALCITONIN: Procalcitonin: 109.94 ng/mL

## 2017-05-09 LAB — PATHOLOGIST SMEAR REVIEW

## 2017-05-09 LAB — LACTIC ACID, PLASMA: LACTIC ACID, VENOUS: 5.7 mmol/L — AB (ref 0.5–1.9)

## 2017-05-09 LAB — MAGNESIUM: Magnesium: 1.6 mg/dL — ABNORMAL LOW (ref 1.7–2.4)

## 2017-05-09 LAB — HIV ANTIBODY (ROUTINE TESTING W REFLEX): HIV SCREEN 4TH GENERATION: NONREACTIVE

## 2017-05-09 MED ORDER — PERFLUTREN LIPID MICROSPHERE
1.0000 mL | INTRAVENOUS | Status: AC | PRN
  Administered 2017-05-09: 5 mL via INTRAVENOUS
  Filled 2017-05-09: qty 10

## 2017-05-09 MED ORDER — LORAZEPAM 2 MG/ML IJ SOLN
2.0000 mg | Freq: Once | INTRAMUSCULAR | Status: AC
  Administered 2017-05-09: 2 mg via INTRAVENOUS
  Filled 2017-05-09: qty 1

## 2017-05-09 MED ORDER — MAGNESIUM SULFATE 2 GM/50ML IV SOLN: 2.0000 g | Freq: Once | INTRAVENOUS | Status: DC

## 2017-05-09 MED ORDER — ARTIFICIAL TEARS OPHTHALMIC OINT
TOPICAL_OINTMENT | OPHTHALMIC | Status: DC | PRN
  Administered 2017-05-10: 2 via OPHTHALMIC
  Administered 2017-05-11: 1 via OPHTHALMIC
  Filled 2017-05-09: qty 3.5

## 2017-05-09 MED ORDER — POTASSIUM PHOSPHATES 15 MMOLE/5ML IV SOLN
30.0000 mmol | Freq: Once | INTRAVENOUS | Status: AC
  Administered 2017-05-09: 30 mmol via INTRAVENOUS
  Filled 2017-05-09: qty 10

## 2017-05-09 MED ORDER — MAGNESIUM SULFATE 2 GM/50ML IV SOLN
2.0000 g | Freq: Once | INTRAVENOUS | Status: AC
  Administered 2017-05-09: 2 g via INTRAVENOUS
  Filled 2017-05-09: qty 50

## 2017-05-09 MED FILL — Medication: Qty: 1 | Status: AC

## 2017-05-09 NOTE — Progress Notes (Signed)
  Echocardiogram 2D Echocardiogram with definity has been performed.  Leta JunglingCooper, Burkley Dech M , 11:39 AM

## 2017-05-09 NOTE — Progress Notes (Signed)
Sister called back, she is in [place] and will not be able to come in.  She spoke with the RN and informed her that she understands that patient is dying and that there is little that can be done for him.  RN informed me of that information and I came to the ICU to discuss code status and plan of care with sister.  RN called sister 3 times with no response.  I am concerned that patient will herniate and arrest overnight but we are unable to get in contact with any family at this point.  Alyson ReedyWesam G. Yacoub, M.D. [facility]monary/Critical Care Medicine. Pager: [phone]. After hours pager: [phone].

## 2017-05-09 NOTE — Progress Notes (Signed)
 PCCM INTERVAL PROGRESS NOTE  Called to bedside by staff RN to evaluate patient for fevers. Admitted earlier today s/p cardiac arrest. Now with high fevers despite tylenol and ice packs. No cooling blankets in house. Will place order for arctic sun normothermia.   Joneen RoachPaul Hoffman, AGACNP-BC [facility] Pager [phone] or [phone]   1:05 AM

## 2017-05-09 NOTE — Progress Notes (Signed)
Sister called back, she is very tearful understandably.  After a long discussion, decision was made that patient would not want that level of care and would not want to be alive with large amount of brain damage.  After discussion, decision was made to make patient a full DNR, start morphine drip and extubate for comfort measures.  The patient is critically ill with multiple organ systems failure and requires high complexity decision making for assessment and support, frequent evaluation and titration of therapies, application of advanced monitoring technologies and extensive interpretation of multiple databases.   Critical Care Time devoted to patient care services described in this note is  45  Minutes. This time reflects time of care of this signee Dr Koren BoundWesam Dasiah Hooley. This critical care time does not reflect procedure time, or teaching time or supervisory time of PA/NP/Med student/Med Resident etc but could involve care discussion time.  Alyson ReedyWesam G. Rakesh Dutko, M.D. [facility]/Critical [facility] Medicine. Pager: [phone]. After hours pager: [phone].

## 2017-05-09 NOTE — Progress Notes (Signed)
 PULMONARY / CRITICAL CARE MEDICINE   Name: Jim Peterson MRN: [id number] DOB: May 15, 1965    ADMISSION DATE:  2017-05-30 CONSULTATION DATE:  2/13  REFERRING MD:  EDP  CHIEF COMPLAINT:  Post arrest  HISTORY OF PRESENT ILLNESS:   52yo male with PMH of tobacco dependence, COPD per report, OSA, DM, depression, substance abuse.  He presented 2/13 after being found unresponsive at a friends house.  He was seen about 15 mins prior to being found.  Upon being found, friend gave him 3 doses of narcan (has hx of substance abuse but reportedly has been clean for 2 - 3 months at least).  After no change with Narcan, EMS was called and upon their arrival, pt found in asystole.  ACLS protocol was initiated and performed for another 15 - 20 minutes prior to ROSC.  He was intubated in the field and brought to ED where he remained unresponsive.  pH was noted to be 6.87, lactic acid 13.97, WBC 13, K 3.4, SCr 2.13. UDS positive for opiates and THC.  Cardiology called in consultation and pt deemed to not be a candidate for any intervention.  PCCM called for admission.  He will not be started on hypothermia protocol given prolonged downtime.  SUBJECTIVE:  No acute events.  Remains unresponsive and has some facial myoclonus this morning. Spoke with sister Burley Saver over the phone.  Stanton Kidney was unaware that pt was hospitalized.  I updated her on events and current condition with poor prognosis. Stanton Kidney will speak with her sister and call the unit back with update regarding code status.  If pt has no improvement over next 24 hours, we will re-visit things and consider comfort care at that time.  VITAL SIGNS: BP (!) 87/77   Pulse 95   Temp (!) 96.8 F (36 C)   Resp (!) 25   Ht 5\' 10"  (1.778 m)   Wt 106.5 kg (234 lb 12.6 oz)   SpO2 100%   BMI 33.69 kg/m   HEMODYNAMICS:    VENTILATOR SETTINGS: Vent Mode: PRVC FiO2 (%):  [60 %-100 %] 100 % Set Rate:  [20 bmp-30 bmp] 30 bmp Vt Set:  [600 mL] 600  mL PEEP:  [5 cmH20] 5 cmH20 Plateau Pressure:  [18 cmH20-36 cmH20] 36 cmH20  INTAKE / OUTPUT: I/O last 3 completed shifts: In: 4923.8 [I.V.:2723.8; IV Piggyback:2200] Out: 2525 [Urine:2525]  PHYSICAL EXAMINATION: General: Adult male, critically ill. Neuro: Unresponsive.  Has facial myoclonus. HEENT: [place]/AT. EOMI, sclerae anicteric. ETT in place. Cardiovascular: RRR, no M/R/G.  Lungs: Respirations even and unlabored.  Coarse bilaterally. Abdomen: BS x 4, soft, NT/ND.  Musculoskeletal: No gross deformities, no edema.  Skin: Intact, warm, no rashes.   LABS:  BMET Recent Labs  Lab May 30, 2017 1602  0344  NA 142 147*  K 3.4* 4.6  CL 104 111  CO2 16* 19*  BUN 21* 33*  CREATININE 2.13* 2.82*  GLUCOSE 352* 113*    Electrolytes Recent Labs  Lab May 30, 2017 1602  0344  CALCIUM 9.1 7.3*  MG  --  1.6*  PHOS  --  2.3*    CBC Recent Labs  Lab May 30, 2017 1602  0344  WBC 13.0* 29.0*  HGB 15.8 20.1*  HCT 47.8 56.4*  PLT 199 210    Coag's Recent Labs  Lab May 30, 2017 1602  APTT 52*  INR 1.20    Sepsis Markers Recent Labs  Lab May 30, 2017 1629 2017-05-30 1930 30-May-2017 2030 2017/05/30 2230  0344  LATICACIDVEN 13.97* 5.8*  --  5.7*  --   PROCALCITON  --   --  4.48  --  109.94    ABG Recent Labs  Lab 05/02/2017 1747 05/22/2017 2300  0350  PHART 7.156* 7.112* 7.341*  PCO2ART 38.8 44.5 42.2  PO2ART 151.0* 108.0 290*    Liver Enzymes Recent Labs  Lab 04/30/2017 1602  AST 74*  ALT 46  ALKPHOS 71  BILITOT 0.6  ALBUMIN 3.3*    Cardiac Enzymes Recent Labs  Lab 04/30/2017 2147  0344  TROPONINI 0.90* 3.93*    Glucose Recent Labs  Lab 05/05/2017 1805 05/19/2017 2005 05/07/2017 2317  0328  0844  GLUCAP 285* 287* 212* 132* 130*    Imaging Ct Head Wo Contrast  Addendum Date: 05/11/2017   ADDENDUM REPORT: 04/28/2017 17:35 ADDENDUM: Critical Value/emergent results were called by telephone at the time of  interpretation on 04/27/2017 at 1729 hrs to Dr. Linwood Dibbles , who verbally acknowledged these results. Electronically Signed   By: Odessa Fleming M.D.   On:  17:35   Result Date:  CLINICAL DATA:  51 year old male status post CPR. EXAM: CT HEAD WITHOUT CONTRAST TECHNIQUE: Contiguous axial images were obtained from the base of the skull through the vertex without intravenous contrast. COMPARISON:  Head CT without contrast 09/12/2014 FINDINGS: Brain: No intracranial mass effect or midline shift, and ventricle size and configuration appears stable since 2016. However, there is widespread loss of bilateral cerebral sulci (compare series 3, image 24 today to series 3, image 26 in 2016). There is subtle diminished gray-white matter differentiation in both hemispheres. Basilar cisterns remain patent. No intracranial hemorrhage identified. No cytotoxic edema is evident. Vascular: Similar generalized intracranial vascular hyperdensity to the 2016 study. Mild Calcified atherosclerosis at the skull base. Skull: Stable and negative. Sinuses/Orbits: Chronic bilateral paranasal sinus mucosal thickening and opacification, mildly progressed compared to 2016. Superimposed fluid in the nasal cavity and visible nasopharynx, the patient is intubated. Tympanic cavities and mastoids remain clear. Other: Stable orbit and scalp soft tissues. IMPRESSION: 1. 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 Brain MRI without contrast would confirm. 2. No intracranial hemorrhage, mass effect, or ventriculomegaly. Electronically Signed: By: Odessa Fleming M.D. On: 05/16/2017 17:27   Dg Chest Port 1 View  Result Date:  CLINICAL DATA:  Respiratory failure EXAM: PORTABLE CHEST 1 VIEW COMPARISON:  Portable chest x-ray of May 08, 2017 FINDINGS: The lungs remain  hyperinflated. The interstitial markings remain increased in the mid and lower left lung and at the right lung base. On the left these are more conspicuous today. The heart is normal in size. The pulmonary vascularity is not engorged. There is no definite pleural effusion but the costophrenic angles are not included in the field of view. The endotracheal tube tip projects 3.5 cm above the carina. The esophagogastric tube tip and proximal port project below the inferior margin of the image. External pacemaker defibrillator pads are present. IMPRESSION: Limited study due to patient positioning. COPD. Bilateral interstitial edema or infiltrates greatest on the left. The appearance of the left lung has deteriorated somewhat since the previous study. The endotracheal tube is now in reasonable position. Electronically Signed   By: David  [place] M.D.   On:  07:36   Dg Chest Portable 1 View  Result Date: 05/22/2017 CLINICAL DATA:  Verify endotracheal tube placement. Status post CPR. EXAM: PORTABLE CHEST  1 VIEW COMPARISON:  None. FINDINGS: Endotracheal tube terminates 6.4 cm above carina.a the endotracheal tube balloon is prominent. Multiple leads and wires project over the chest. External pacer/defibrillator. Apical lordotic positioning. Mild cardiomegaly, accentuated be AP portable technique. Apparent superior mediastinal soft tissue fullness. Left costophrenic angle excluded. No gross pleural fluid or pneumothorax. Low lung volumes with resultant pulmonary interstitial prominence. Possible left suprahilar airspace disease. IMPRESSION: Endotracheal tube terminates 6.4 cm above carina. The balloon is prominent. Consider minimal advancement and deflation of endotracheal balloon. Superior mediastinal soft tissue fullness could be technique related but is nonspecific. Similarly, cannot exclude left suprahilar airspace disease (atelectasis or aspiration given the clinical history). Electronically Signed   By: Jeronimo GreavesKyle   Talbot M.D.   On: 06/12/17 16:20     STUDIES:  CT head 2/13 > subtle loss of cerebral sulci and gray white matter suggesting early diffuse cerebral edema likely due to anoxia Echo 2/13 >  EEG 2/13 >   CULTURES: BC x 2 2/13 > Urine 2/13 >  Sputum 2/13 >   ANTIBIOTICS: Vanc 2/13 > Zosyn 2/13 >  SIGNIFICANT EVENTS: 2/13 > admit with asystolic arrest, UDS positive for opiates and THC  LINES/TUBES: ETT 2/13 >  DISCUSSION: 52yo male with hx polysubstance abuse with asystolic arrest with at least 30 mins downtime prior to ROSC. Did not respond to narcan in the field.  UDS positive for UDS and THC.  ASSESSMENT / PLAN:  PULMONARY Acute respiratory failure - due to inability to protect the airway in setting cardiac arrest. Hx OSA, COPD per report, tobacco dependence. P:   Vent support - 8cc/kg. Daily SBT - on hold now given unresponsive state with concern for anoxic injury. Follow ABG. Bronchial hygiene. Follow CXR.  CARDIOVASCULAR Asystolic arrest - unclear etiology at this point.  ? Drug abuse.  UDS pending. Hypertensive urgency. Troponin bump - due to above. P:  Poor candidate for hypothermia protocol (also hypothermic on arrival). Cardiology consulted, no interventions planned. Assess echo.  Labetalol PRN / Hydralazine PRN.  RENAL AKI. Lactic acidosis - presumed due to prolonged downtime.  At this point in time, NOT felt to be due to sepsis. Hypokalemia - resolved. Hypophosphatemia. Hypomagnesemia. P:   NS @ 100. 2g Mag. 30 mmol K phos. Follow BMP.  GASTROINTESTINAL GI prophylaxis. P:   NPO. PPI .  HEMATOLOGIC A:   Hemoconcentration. VTE prophylaxis. P:  SCD's / heparin. CBC in AM.  INFECTIOUS A:   ? Occult infection - no clear source at this time; however, PCT significantly elevated at 109, WBC 29, overnight spiked fever. P:   Continue empiric abx for now (vanc / zosyn). Follow cultures. Follow PCT.  ENDOCRINE A:   Hx DM.   P:    SSI.  NEUROLOGIC AMS - post asystolic arrest - prolonged downtime, likely anoxic injury. Hx substance abuse - UDS positive for opiates and THC. P:   Sedation:  Propofol gtt / fentanyl gtt / Midazolam PRN (currently not requiring any sedation). RASS goal: 0 to -1. Daily WUA. Assess EEG to help with prognostication, suspect severe anoxia.   FAMILY  - Updates: No family available. Spoke with sister Burley SaverDebra Lewis over the phone.  Stanton Kid[place]Debra lives out of state and she was unaware that pt was hospitalized.  I updated her on events and current condition with poor prognosis. Stanton KidneyDebra will speak with her sister and call the unit back with update regarding code status.  Recommended DNR for now and if pt has no improvement over next  24 hours, we will need re-visit things and consider comfort care at that time.  - Inter-disciplinary family meet or Palliative Care meeting due by:  05/15/17.  CC time: 30 min.   Rutherford Guys, [place] - C  Pulmonary & Critical Care Medicine Pager: [phone]  or [phone] , 8:58 AM  Attending Note:  52 year old male with polysubstance abuse history who presents to the hospital after being found down for unknown period of time.  Patient was asystolic upon presentation and prolonged time prior to ROSC.  On exam, respiratory drive is present but no other reflexes present.  I reviewed CXR myself, no acute disease noted, ETT is in good position.  Discussed with PCCM-NP.  Sister was called, they are evidently estranged but she was informed that she is the only available next of kin.  She will communicate with the rest of the family and call us back.  Will recommend full DNR as patient has evidence of brain edema on CT and the neuro exam is completely flat with no evidence of seizure on EEG.  The patient is critically ill with multiple organ systems failure and requires high complexity decision making for assessment and support, frequent evaluation and titration  of therapies, application of advanced monitoring technologies and extensive interpretation of multiple databases.   Critical Care Time devoted to patient care services described in this note is  45  Minutes. This time reflects time of care of this signee Dr Koren Bound. This critical care time does not reflect procedure time, or teaching time or supervisory time of PA/NP/Med student/Med Resident etc but could involve care discussion time.  Alyson Reedy, M.D. [facility] Pulmonary/Critical Care Medicine. Pager: [phone]. After hours pager: [phone].

## 2017-05-09 NOTE — Care Management Note (Signed)
 Case Management Note  Patient Details  Name: Marzetta BoardRoger Bingman MRN: [id number] Date of Birth: [birth year]  Subjective/Objective:  polysubstance abuse with asystolic arrest with at least 30 mins downtime prior to ROSC. Did not respond to narcan in the field.  UDS positive for UDS and THC.                     Action/Plan: NCM will follow for transition of care.  Expected Discharge Date:                  Expected Discharge Plan:     In-House Referral:     Discharge planning Services  CM Consult  Post Acute Care Choice:    Choice offered to:     DME Arranged:    DME Agency:     HH Arranged:    HH Agency:     Status of Service:  In process, will continue to follow  If discussed at Long Length of Stay Meetings, dates discussed:    Additional Comments:  Leone Havenaylor, Lynzi Meulemans [place], RN , 3:15 PM

## 2017-05-09 NOTE — Progress Notes (Signed)
Sister, Gavin PoundDeborah, was contacted and updated on patient's condition. After a discussion with Dr. Molli KnockYacoub, plan is to make pt DNR and withdrawal care. Sister is in [place] and unable to visit. Skype video-call was set up with the assistance of patient's friends at the bedside. Chaplain was called for additional emotional and spiritual support.

## 2017-05-09 NOTE — Progress Notes (Signed)
EEG completed, results pending. 

## 2017-05-09 NOTE — Procedures (Signed)
 ELECTROENCEPHALOGRAM REPORT  Date of Study:   Patient's Name: Jim BoardRoger Peterson MRN: [id number] Date of Birth: May 13, 1965  Referring Provider: Rutherford Guysahul Desai, PA-C  Clinical History: 52 year old male with history of substance abuse found in asystole.  Medications: Propofol Fentanyl ASA Tylenol Novolog Labetolol Protonix Zosyn Vancomycin  Technical Summary: A multichannel digital EEG recording measured by the international 10-20 system with electrodes applied with paste and impedances below 5000 ohms performed in our laboratory with EKG monitoring in an intubated patient.  Hyperventilation and photic stimulation were not performed.  The digital EEG was referentially recorded, reformatted, and digitally filtered in a variety of bipolar and referential montages for optimal display.    Description: The patient is intubated and unresponsive, propofol and fentanyl sedation discontinued approximately 2 hours prior to the recording. There is significant muscle artifact that compromises the background.  There is diffuse suppression of background activity. The patient spontaneously opens and closes his eyes which demonstrates increased muscle artifact but no apparent change to the EEG background. Hyperventilation and photic stimulation were not performed. There were no epileptiform discharges or electrographic seizures seen.   EKG lead was unremarkable.  Impression: This EEG is markedly abnormal due to diffuse background suppression and lack of EEG reactivity with noxious stimulation.  Clinical Correlation of the above findings indicates severe diffuse cerebral dysfunction that is non-specific in etiology and can be seen in the setting of anoxic/ischemic injury, toxic/metabolic encephalopathies, or medication effect. Clinical correlation is advised.  Shon MilletAdam Almedia Cordell, DO

## 2017-05-09 NOTE — Progress Notes (Signed)
150ml of fentanyl wasted in sink with Ellwood HandlerLisa Yow, RN

## 2017-05-09 NOTE — Progress Notes (Signed)
 Initial Nutrition Assessment  DOCUMENTATION CODES:   Obesity unspecified  INTERVENTION:    If TF started, rec Vital High Protein at goal rate of 45 ml/h (1080 ml per day) and Prostat 60 ml BID  Provides 1480 kcal, 154 gm protein, 903 ml of free water daily  NUTRITION DIAGNOSIS:   Inadequate oral intake related to inability to eat as evidenced by NPO status  GOAL:   Provide needs based on ASPEN/SCCM guidelines  MONITOR:   Vent status, Labs, Skin, Weight trends, I & O's  REASON FOR ASSESSMENT:   Ventilator  ASSESSMENT:   52 yo Male with PMH of tobacco dependence, COPD per report, OSA, DM, depression, substance abuse; s/p cardiac arrest.  Patient is currently intubated on ventilator support Temp (24hrs), Avg:100.2 F (37.9 C), Min:96.8 F (36 C), Max:103.5 F (39.7 C)  Admitted with asystolic arrest. UDS positive for opiates and THC. Cardiology has assessed pt. Concluded he is not appropriate for any intervention. S/p EEG today. Impression is abnormal. Lack of reactivity with stimulation.   Labs reviewed. Na 147 (H). Mg 1.6 (L). Phos 2.3 (L). Medications reviewed. CBG's 132-130-165. Prognosis is overall poor.  NUTRITION - FOCUSED PHYSICAL EXAM:    Most Recent Value  Orbital Region  No depletion  Upper Arm Region  No depletion  Thoracic and Lumbar Region  No depletion  Buccal Region  No depletion  Temple Region  No depletion  Clavicle Bone Region  No depletion  Clavicle and Acromion Bone Region  No depletion  Scapular Bone Region  No depletion  Dorsal Hand  No depletion  Patellar Region  No depletion  Anterior Thigh Region  No depletion  Posterior Calf Region  No depletion  Edema (RD Assessment)  None     Diet Order:  Diet NPO time specified  EDUCATION NEEDS:   Not appropriate for education at this time  Skin:  Skin Assessment: Reviewed RN Assessment  Last BM:  PTA   Intake/Output Summary (Last 24 hours) at  1336 Last data filed at   1200 Gross per 24 hour  Intake 5907.36 ml  Output 2865 ml  Net 3042.36 ml   Height:   Ht Readings from Last 1 Encounters:  January 23, 2018 5\' 10"  (1.778 m)   Weight:   Wt Readings from Last 1 Encounters:   234 lb 12.6 oz (106.5 kg)   Ideal Body Weight:  75.4 kg  BMI:  Body mass index is 33.69 kg/m.  Estimated Nutritional Needs:   Kcal:  [id number]  Protein:  >/= 150 gm  Fluid:  per MD  Maureen ChattersKatie Carson Bogden, RD, LDN Pager #: [phone] After-Hours Pager #: [phone]

## 2017-05-09 NOTE — Progress Notes (Signed)
RT note-SBT performed and cuff leak test per Donor services, RN aware, cuff leak is positive and patient does ventilate on his own.

## 2017-05-09 NOTE — Progress Notes (Signed)
    1800  Clinical Encounter Type  Visited With Patient and family together;Health care provider  Visit Type Patient actively dying  Referral From Nurse  Consult/Referral To Chaplain  Spiritual Encounters  Spiritual Needs Emotional;Prayer  Stress Factors  Family Stress Factors Major life changes   Responded to a page from 2H to support a family at bedside of a patient at EOL.  Upon arriving Nurse let me know the two at bedside are long time friends of the patient and the sisters do not live locally and are not able to come.  Supported the friends with listening and offering hospitality.  One of the sisters was able to Skype in and we all prayed together.  Friends needed some time to just be with the patient.  Will follow up later this evening. Chaplain Agustin CreeNewton Euretha Najarro

## 2017-05-09 NOTE — Progress Notes (Signed)
Called [facility] with critical troponin 0.09, noting myoclonic activity, and ABG results. New orders given.

## 2017-05-09 NOTE — Progress Notes (Signed)
 [facility] Physician-Brief Progress Note Patient Name: Jim BoardRoger Peterson DOB: 01-25-1966 MRN: [id number]   Date of Service    HPI/Events of Note  Hypomag  eICU Interventions  Mag replaced     Intervention Category Intermediate Interventions: Electrolyte abnormality - evaluation and management  [name] , 6:16 AM

## 2017-05-10 ENCOUNTER — Inpatient Hospital Stay (HOSPITAL_COMMUNITY): Payer: Medicare Other

## 2017-05-10 LAB — GLUCOSE, CAPILLARY
GLUCOSE-CAPILLARY: 102 mg/dL — AB (ref 65–99)
GLUCOSE-CAPILLARY: 165 mg/dL — AB (ref 65–99)
GLUCOSE-CAPILLARY: 173 mg/dL — AB (ref 65–99)
GLUCOSE-CAPILLARY: 189 mg/dL — AB (ref 65–99)
Glucose-Capillary: 214 mg/dL — ABNORMAL HIGH (ref 65–99)
Glucose-Capillary: 159 mg/dL — ABNORMAL HIGH (ref 65–99)
Glucose-Capillary: 174 mg/dL — ABNORMAL HIGH (ref 65–99)
Glucose-Capillary: 39 mg/dL — CL (ref 65–99)
Glucose-Capillary: 61 mg/dL — ABNORMAL LOW (ref 65–99)

## 2017-05-10 LAB — BASIC METABOLIC PANEL
Anion gap: 14 (ref 5–15)
BUN: 43 mg/dL — ABNORMAL HIGH (ref 6–20)
CALCIUM: 7.5 mg/dL — AB (ref 8.9–10.3)
CHLORIDE: 107 mmol/L (ref 101–111)
CO2: 22 mmol/L (ref 22–32)
Creatinine, Ser: 2.18 mg/dL — ABNORMAL HIGH (ref 0.61–1.24)
GFR calc non Af Amer: 33 mL/min — ABNORMAL LOW (ref >60)
GFR, EST AFRICAN AMERICAN: 38 mL/min — AB (ref >60)
GLUCOSE: 209 mg/dL — AB (ref 65–99)
Potassium: 3.9 mmol/L (ref 3.5–5.1)
Sodium: 143 mmol/L (ref 135–145)

## 2017-05-10 LAB — BLOOD GAS, ARTERIAL
ACID-BASE EXCESS: 1.8 mmol/L (ref 0.0–2.0)
Bicarbonate: 25.6 mmol/L (ref 20.0–28.0)
Drawn by: 51702
FIO2: 70
LHR: 35 {breaths}/min
MECHVT: 600 mL
O2 SAT: 95.2 %
PATIENT TEMPERATURE: 98.6
PCO2 ART: 38.3 mmHg (ref 32.0–48.0)
PEEP/CPAP: 5 cmH2O
PH ART: 7.44 (ref 7.350–7.450)
PO2 ART: 80.2 mmHg — AB (ref 83.0–108.0)

## 2017-05-10 LAB — PHOSPHORUS: PHOSPHORUS: 3.1 mg/dL (ref 2.5–4.6)

## 2017-05-10 LAB — CBC
HCT: 53.5 % — ABNORMAL HIGH (ref 39.0–52.0)
HEMOGLOBIN: 19.2 g/dL — AB (ref 13.0–17.0)
MCH: 35 pg — AB (ref 26.0–34.0)
MCHC: 35.9 g/dL (ref 30.0–36.0)
MCV: 97.6 fL (ref 78.0–100.0)
Platelets: 165 10*3/uL (ref 150–400)
RBC: 5.48 MIL/uL (ref 4.22–5.81)
RDW: 14.1 % (ref 11.5–15.5)
WBC: 27.7 10*3/uL — ABNORMAL HIGH (ref 4.0–10.5)

## 2017-05-10 LAB — PROCALCITONIN: Procalcitonin: 64.89 ng/mL

## 2017-05-10 LAB — MAGNESIUM: Magnesium: 2 mg/dL (ref 1.7–2.4)

## 2017-05-10 MED ORDER — DEXTROSE 50 % IV SOLN
INTRAVENOUS | Status: AC
  Administered 2017-05-10: 50 mL
  Filled 2017-05-10: qty 50

## 2017-05-10 MED ORDER — SODIUM CHLORIDE 0.9 % IV SOLN
0.0000 ug/min | INTRAVENOUS | Status: DC
  Administered 2017-05-10: 20 ug/min via INTRAVENOUS
  Administered 2017-05-11: 210 ug/min via INTRAVENOUS
  Administered 2017-05-11: 130 ug/min via INTRAVENOUS
  Administered 2017-05-11: 140 ug/min via INTRAVENOUS
  Administered 2017-05-12: 100 ug/min via INTRAVENOUS
  Administered 2017-05-12: 130 ug/min via INTRAVENOUS
  Filled 2017-05-10: qty 40
  Filled 2017-05-10 (×6): qty 4

## 2017-05-10 MED ORDER — SODIUM CHLORIDE 0.9 % IV SOLN
10.0000 mg/h | INTRAVENOUS | Status: DC
  Administered 2017-05-12: 10 mg/h via INTRAVENOUS
  Filled 2017-05-10 (×2): qty 10

## 2017-05-10 MED ORDER — MORPHINE BOLUS VIA INFUSION
5.0000 mg | INTRAVENOUS | Status: DC | PRN
  Filled 2017-05-10: qty 20

## 2017-05-10 NOTE — Progress Notes (Signed)
Hypoglycemic Event  CBG: 31  Treatment: D50 IV 50 mL  Symptoms: None (pt intubated/unresponsive)  Follow-up CBG: Time:23:45 CBG Result:173  Possible Reasons for Event: Inadequate meal intake and Other: infection, fever, shivering, increased metabolic need  Comments/MD notified: [facility] notified, awaiting further orders. Will continue to monitor.    Meriel Flavorsachel R Milisa Kimbell

## 2017-05-10 NOTE — Progress Notes (Signed)
Helping unit RT- RN called d/t pt desaturating at 83% on 100% fio2.  Performed recruitment maneuver w/ little improvement.  Peep increased to 8, sat now 93%.  Pt appears slightly asynchronous on vent.  RN and NP aware.

## 2017-05-10 NOTE — Progress Notes (Signed)
 PULMONARY / CRITICAL CARE MEDICINE   Name: Jim Peterson MRN: [id number] DOB: 1965-12-09    ADMISSION DATE:  05/19/2017 CONSULTATION DATE:  2/13  REFERRING MD:  EDP  CHIEF COMPLAINT:  Post arrest  HISTORY OF PRESENT ILLNESS:   52yo male with PMH of tobacco dependence, COPD per report, OSA, DM, depression, substance abuse.  He presented 2/13 after being found unresponsive at a friends house.  He was seen about 15 mins prior to being found.  Upon being found, friend gave him 3 doses of narcan (has hx of substance abuse but reportedly has been clean for 2 - 3 months at least).  After no change with Narcan, EMS was called and upon their arrival, pt found in asystole.  ACLS protocol was initiated and performed for another 15 - 20 minutes prior to ROSC.  He was intubated in the field and brought to ED where he remained unresponsive.  pH was noted to be 6.87, lactic acid 13.97, WBC 13, K 3.4, SCr 2.13. UDS positive for opiates and THC.  Cardiology called in consultation and pt deemed to not be a candidate for any intervention.  PCCM called for admission.  He will not be started on hypothermia protocol given prolonged downtime.  SUBJECTIVE: Severe anoxic injury is noted.  His sisters had a change of heart concerning comfort care is not ready for that at this time.  VITAL SIGNS: BP 126/79   Pulse 78   Temp (!) 100.4 F (38 C) (Oral)   Resp 19   Ht 5\' 10"  (1.778 m)   Wt 107.9 kg (237 lb 14 oz)   SpO2 (!) 82%   BMI 34.13 kg/m   HEMODYNAMICS:    VENTILATOR SETTINGS: Vent Mode: PRVC FiO2 (%):  [0.7 %-100 %] 100 % Set Rate:  [35 bmp-60 bmp] 35 bmp Vt Set:  [600 mL] 600 mL PEEP:  [5 cmH20-8 cmH20] 8 cmH20 Plateau Pressure:  [21 cmH20-24 cmH20] 24 cmH20  INTAKE / OUTPUT: I/O last 3 completed shifts: In: 4337.4 [I.V.:3577.4; IV Piggyback:760] Out: 1825 [Urine:1825]  PHYSICAL EXAMINATION: General: Appears older than stated age HEENT: Negative doll's eyes, pupils fixed PSY: Not  available Neuro: Other than having a respiratory drive negative.  No gag reflex, no corneal reflex. CV: Heart sounds are distant PULM: Coarse rhonchi bilaterally GI: Soft Extremities: warm/dry, plus edema, right index finger has been surgically removed in the past Skin: no rashes or lesions    LABS:  BMET Recent Labs  Lab 05/03/2017 1602  0344 05/10/17 0330  NA 142 147* 143  K 3.4* 4.6 3.9  CL 104 111 107  CO2 16* 19* 22  BUN 21* 33* 43*  CREATININE 2.13* 2.82* 2.18*  GLUCOSE 352* 113* 209*    Electrolytes Recent Labs  Lab 04/28/2017 1602  0344 05/10/17 0330  CALCIUM 9.1 7.3* 7.5*  MG  --  1.6* 2.0  PHOS  --  2.3* 3.1    CBC Recent Labs  Lab 05/07/2017 1602  0344 05/10/17 0330  WBC 13.0* 29.0* 27.7*  HGB 15.8 20.1* 19.2*  HCT 47.8 56.4* 53.5*  PLT 199 210 165    Coag's Recent Labs  Lab 05/05/2017 1602  APTT 52*  INR 1.20    Sepsis Markers Recent Labs  Lab 05/21/2017 1629 05/10/2017 1930 05/11/2017 2030 05/17/2017 2230  0344 05/10/17 0330  LATICACIDVEN 13.97* 5.8*  --  5.7*  --   --   PROCALCITON  --   --  4.48  --  109.94 64.89  ABG Recent Labs  Lab  0350  0846 05/10/17 0325  PHART 7.341* 7.359 7.440  PCO2ART 42.2 35.0 38.3  PO2ART 290* 75.0* 80.2*    Liver Enzymes Recent Labs  Lab Jun 04, 2017 1602  AST 74*  ALT 46  ALKPHOS 71  BILITOT 0.6  ALBUMIN 3.3*    Cardiac Enzymes Recent Labs  Lab Jun 04, 2017 2147  0344  TROPONINI 0.90* 3.93*    Glucose Recent Labs  Lab  1134  1602  2018  2354 05/10/17 0320 05/10/17 0723  GLUCAP 165* 238* 185* 214* 189* 165*    Imaging Dg Chest Port 1 View  Result Date: 05/10/2017 CLINICAL DATA:  Respiratory failure EXAM: PORTABLE CHEST 1 VIEW COMPARISON:   FINDINGS: Endotracheal tube not well seen due to overlying EKG lead. NG tube in the stomach. Progression of bibasilar airspace disease and small  left effusion. Negative for heart failure. IMPRESSION: Progression of bibasilar airspace disease and small left effusion. Possibly atelectasis or pneumonia. Endotracheal tube not well seen due to overlying EKG leads. Electronically Signed   By: Marlan Palauharles  Clark M.D.   On: 05/10/2017 07:56     STUDIES:  CT head 2/13 > subtle loss of cerebral sulci and gray white matter suggesting early diffuse cerebral edema likely due to anoxia Echo 2/13 >  EEG 2/13 >   CULTURES: BC x 2 2/13 > Urine 2/13 >  Sputum 2/13 >   ANTIBIOTICS: Vanc 2/13 > Zosyn 2/13 >  SIGNIFICANT EVENTS: 2/13 > admit with asystolic arrest, UDS positive for opiates and THC  LINES/TUBES: ETT 2/13 >  DISCUSSION: 52yo male with hx polysubstance abuse with asystolic arrest with at least 30 mins downtime prior to ROSC. Did not respond to narcan in the field.  UDS positive for opiates and THC.  ASSESSMENT / PLAN:  PULMONARY Acute respiratory failure - due to inability to protect the airway in setting cardiac arrest. Hx OSA, COPD per report, tobacco dependence. P:   Vent support - 8cc/kg. Daily SBT - on hold now given unresponsive state with concern for anoxic injury. Follow ABG. Bronchial hygiene. Follow CXR.  CARDIOVASCULAR Asystolic arrest - unclear etiology at this point.  ? Drug abuse.  UDS pending. Hypertensive urgency. Troponin bump - due to above. P:  Poor candidate for hypothermia protocol (also hypothermic on arrival). Cardiology consulted, no interventions planned. Assess echo.  Labetalol PRN / Hydralazine PRN.  RENAL Lab Results  Component Value Date   CREATININE 2.18 (H) 05/10/2017   CREATININE 2.82 (H)    CREATININE 2.13 (H) 2017-03-31   Recent Labs  Lab Jun 04, 2017 1602  0344 05/10/17 0330  K 3.4* 4.6 3.9   Recent Labs  Lab Jun 04, 2017 1602  0344 05/10/17 0330  NA 142 147* 143    AKI. Lactic acidosis - presumed due to prolonged downtime.  At this point in time,  NOT felt to be due to sepsis. Hypokalemia - resolved. Hypophosphatemia. Hypomagnesemia. P:   NS @ 100. Repeat lactic Follow BMP.  GASTROINTESTINAL GI prophylaxis. P:   NPO. PPI .  HEMATOLOGIC Recent Labs     0344 05/10/17 0330  HGB 20.1* 19.2*    A:   Hemoconcentration. VTE prophylaxis. P:  SCD's / heparin. CBC in AM.  INFECTIOUS A:   ? Occult infection - no clear source at this time; however, PCT significantly elevated at 109, WBC 29, overnight spiked fever. P:   Continue empiric abx for now (vanc / zosyn). Follow cultures. Follow PCT 64.89 on 05/10/2017  ENDOCRINE CBG (last  3)  Recent Labs     2354 05/10/17 0320 05/10/17 0723  GLUCAP 214* 189* 165*    A:   Hx DM.   P:   SSI.  NEUROLOGIC AMS - post asystolic arrest - prolonged downtime, likely anoxic injury. Hx substance abuse - UDS positive for opiates and THC. P:   Sedation:  Propofol gtt / fentanyl gtt / Midazolam PRN (currently not requiring any sedation). RASS goal: 0 to -1. Daily WUA. Assess EEG to help with prognostication, suspect severe anoxia. He has severe brain dysfunction and has little chance of any meaningful recovery.  FAMILY  - Updates: 05/10/2017 no family at bedside. 05/10/2017 sister was called concerning comfort care if there is a note from yesterday that she was ready for DNR status but now she states she does not want comfort care is not ready for withdrawal.  We will continue with current care at this time.  - Inter-disciplinary family meet or Palliative Care meeting due by:  05/15/17.  CC time: 30 min.   Brett Canales Minor ACNP Adolph Pollack PCCM Pager [phone] till 1 pm If no answer page [phone] 05/10/2017, 11:27 AM

## 2017-05-10 NOTE — Progress Notes (Addendum)
Hypoglycemic Event  CBG: 39 (from arterial line)  Treatment: D50 IV 50 mL given at 20:00  Symptoms: None  (UTA, pt intubated/unresponsive)  Follow-up CBG: Time: 20:15 CBG Result:174  Possible Reasons for Event: Inadequate meal intake and Other: clinical condition  Comments/MD notified:Will continue to monitor closely.    Meriel Flavorsachel R Adrain Butrick

## 2017-05-10 NOTE — Significant Event (Signed)
Patient has existing signed and held orders for comfort care measures since yesterday afternoon. RN called patient's sister Eunice BlaseDebbie to get further clarification on this as well as to provide updates and support. Patient's sister expressed she is not ready for patient to be on comfort care at this time. CDS Thereasa DistanceRodney also spoke with patient's sister and gave same message that she is not ready for comfort care at this time. Patient's sister unable to speak with RN further as she stated she has company and will call RN later.   Jim Peterson

## 2017-05-11 ENCOUNTER — Inpatient Hospital Stay (HOSPITAL_COMMUNITY): Payer: Medicare Other

## 2017-05-11 LAB — BLOOD CULTURE ID PANEL (REFLEXED)
ACINETOBACTER BAUMANNII: NOT DETECTED
CANDIDA ALBICANS: NOT DETECTED
CANDIDA TROPICALIS: NOT DETECTED
Candida glabrata: NOT DETECTED
Candida krusei: NOT DETECTED
Candida parapsilosis: NOT DETECTED
ENTEROBACTERIACEAE SPECIES: NOT DETECTED
ENTEROCOCCUS SPECIES: NOT DETECTED
Enterobacter cloacae complex: NOT DETECTED
Escherichia coli: NOT DETECTED
HAEMOPHILUS INFLUENZAE: NOT DETECTED
KLEBSIELLA PNEUMONIAE: NOT DETECTED
Klebsiella oxytoca: NOT DETECTED
Listeria monocytogenes: NOT DETECTED
NEISSERIA MENINGITIDIS: NOT DETECTED
PSEUDOMONAS AERUGINOSA: NOT DETECTED
Proteus species: NOT DETECTED
STREPTOCOCCUS AGALACTIAE: NOT DETECTED
STREPTOCOCCUS PNEUMONIAE: NOT DETECTED
STREPTOCOCCUS SPECIES: NOT DETECTED
Serratia marcescens: NOT DETECTED
Staphylococcus aureus (BCID): NOT DETECTED
Staphylococcus species: NOT DETECTED
Streptococcus pyogenes: NOT DETECTED

## 2017-05-11 LAB — GLUCOSE, CAPILLARY
GLUCOSE-CAPILLARY: 124 mg/dL — AB (ref 65–99)
GLUCOSE-CAPILLARY: 209 mg/dL — AB (ref 65–99)
GLUCOSE-CAPILLARY: 285 mg/dL — AB (ref 65–99)
GLUCOSE-CAPILLARY: 66 mg/dL (ref 65–99)
GLUCOSE-CAPILLARY: 77 mg/dL (ref 65–99)
GLUCOSE-CAPILLARY: 84 mg/dL (ref 65–99)
Glucose-Capillary: 115 mg/dL — ABNORMAL HIGH (ref 65–99)
Glucose-Capillary: 158 mg/dL — ABNORMAL HIGH (ref 65–99)
Glucose-Capillary: 184 mg/dL — ABNORMAL HIGH (ref 65–99)
Glucose-Capillary: 186 mg/dL — ABNORMAL HIGH (ref 65–99)
Glucose-Capillary: 263 mg/dL — ABNORMAL HIGH (ref 65–99)
Glucose-Capillary: 89 mg/dL (ref 65–99)

## 2017-05-11 LAB — BASIC METABOLIC PANEL
ANION GAP: 12 (ref 5–15)
BUN: 65 mg/dL — ABNORMAL HIGH (ref 6–20)
CHLORIDE: 108 mmol/L (ref 101–111)
CO2: 27 mmol/L (ref 22–32)
Calcium: 7 mg/dL — ABNORMAL LOW (ref 8.9–10.3)
Creatinine, Ser: 3.03 mg/dL — ABNORMAL HIGH (ref 0.61–1.24)
GFR calc non Af Amer: 22 mL/min — ABNORMAL LOW (ref >60)
GFR, EST AFRICAN AMERICAN: 26 mL/min — AB (ref >60)
Glucose, Bld: 93 mg/dL (ref 65–99)
POTASSIUM: 4.9 mmol/L (ref 3.5–5.1)
SODIUM: 147 mmol/L — AB (ref 135–145)

## 2017-05-11 LAB — TRIGLYCERIDES: Triglycerides: 213 mg/dL — ABNORMAL HIGH (ref <150)

## 2017-05-11 LAB — MAGNESIUM: MAGNESIUM: 2.6 mg/dL — AB (ref 1.7–2.4)

## 2017-05-11 LAB — PHOSPHORUS: PHOSPHORUS: 6.7 mg/dL — AB (ref 2.5–4.6)

## 2017-05-11 MED ORDER — VANCOMYCIN HCL IN DEXTROSE 1-5 GM/200ML-% IV SOLN
1000.0000 mg | Freq: Two times a day (BID) | INTRAVENOUS | Status: DC
Start: 2017-05-11 — End: 2017-05-12
  Administered 2017-05-11 – 2017-05-12 (×2): 1000 mg via INTRAVENOUS
  Filled 2017-05-11 (×3): qty 200

## 2017-05-11 MED ORDER — SODIUM CHLORIDE 4 MEQ/ML IV SOLN
INTRAVENOUS | Status: DC
  Administered 2017-05-11: 01:00:00 via INTRAVENOUS
  Filled 2017-05-11 (×2): qty 1000

## 2017-05-11 MED ORDER — DEXTROSE 50 % IV SOLN
INTRAVENOUS | Status: AC
  Administered 2017-05-11: 25 mL
  Filled 2017-05-11: qty 50

## 2017-05-11 NOTE — Progress Notes (Signed)
Paused D10 KCL fluids d/t worsening kidney function, poor potassium excretion., K 4.9 this AM. Will replace with D10W for now and address during AM rounds.

## 2017-05-11 NOTE — Progress Notes (Signed)
0300 BG 77, was 115 at 0200. D10 fluids have been running throughout. Pt trying to spike a temp, water temp currently 4.8 on arctic sun.  Notified [facility] of this, awaiting further orders.

## 2017-05-11 NOTE — Progress Notes (Signed)
 Pharmacy Antibiotic Note  Marzetta BoardRoger Vogel is a 52 y.o. male admitted on  with sepsis.  Pharmacy has been consulted for vancomycin and zosyn dosing.  Tmax 100.4, wbc elevated at 27 yesterday, scr trending up to 3.0 today. Random vancomycin level of 13 on 2/15, dose held yesterday d/t ongoing palliative discussion. No definite decision made yet and 1/2 positive blood cultures noted overnight (BCID was negative). Will give one time dose of vancomycin this morning and continue to follow plan. No dose adjustments needed for zosyn.   Vancomycin trough goal 15-20  Plan: 1) Vancomycin 1g IV now - hold further dosing based on plan, will need random level prior to redosing in about 48 hours 2) Zosyn 3.375g IV q8 (4 hour infusion) 3) Follow renal function, cultures, LOT, level if needed  Height: 5\' 10"  (177.8 cm) Weight: 238 lb 1.6 oz (108 kg) IBW/kg (Calculated) : 73  Temp (24hrs), Avg:98.5 F (36.9 C), Min:97.2 F (36.2 C), Max:99.5 F (37.5 C)  Recent Labs  Lab 05/02/2017 1602 05/15/2017 1629 05/23/2017 1930 05/11/2017 2230  0344  1655 05/10/17 0330 05/11/17 0404  WBC 13.0*  --   --   --  29.0*  --  27.7*  --   CREATININE 2.13*  --   --   --  2.82*  --  2.18* 3.03*  LATICACIDVEN  --  13.97* 5.8* 5.7*  --   --   --   --   VANCORANDOM  --   --   --   --   --  13  --   --     Estimated Creatinine Clearance: 35.1 mL/min (A) (by C-G formula based on SCr of 3.03 mg/dL (H)).    Allergies  Allergen Reactions  . Asa [Aspirin]   . Trazodone And Nefazodone   . Wellbutrin [Bupropion]     Antimicrobials this admission: 2/13 Vancomycin >> 2/13 Zosyn >>  Microbiology results: 2/13 blood x2>> 1/2 GPC  Thank you for allowing pharmacy to be a part of this patient's care.  Sheppard CoilFrank Sonika Levins PharmD., BCPS Clinical Pharmacist 05/11/2017 10:09 AM

## 2017-05-11 NOTE — Progress Notes (Signed)
BG 84 about an hour after giving 50mL D50 (which raised BG from 31 to 173). Given rapid rate BG is decreasing, started D10 at 50cc/hr to prevent hypoglycemia while waiting on ordered drip.  Will continue to monitor closely.

## 2017-05-11 NOTE — Progress Notes (Signed)
F/U BG 66 an hour after starting D10 fluids (BG was 84 when fluids started). [facility] notified. Given initial order for 25cc D50. Awaiting further orders. Will continue to monitor closely.

## 2017-05-11 NOTE — Progress Notes (Signed)
 PULMONARY / CRITICAL CARE MEDICINE   Name: Jim Peterson MRN: [id number] DOB: 1965-06-20    ADMISSION DATE:  05/04/2017 CONSULTATION DATE:  2/13  REFERRING MD:  EDP  CHIEF COMPLAINT:  Post arrest  HISTORY OF PRESENT ILLNESS:   52yo male with PMH of tobacco dependence, COPD per report, OSA, DM, depression, substance abuse.  He presented 2/13 after being found unresponsive at a friends house.  He was seen about 15 mins prior to being found.  Upon being found, friend gave him 3 doses of narcan (has hx of substance abuse but reportedly has been clean for 2 - 3 months at least).  After no change with Narcan, EMS was called and upon their arrival, pt found in asystole.  ACLS protocol was initiated and performed for another 15 - 20 minutes prior to ROSC.  He was intubated in the field and brought to ED where he remained unresponsive.  pH was noted to be 6.87, lactic acid 13.97, WBC 13, K 3.4, SCr 2.13. UDS positive for opiates and THC.  Cardiology called in consultation and pt deemed to not be a candidate for any intervention.  PCCM called for admission.  He will not be started on hypothermia protocol given prolonged downtime.  SUBJECTIVE: Severe anoxic injury is noted.  His sisters had a change of heart concerning comfort care is not ready for that at this time.She uis aware of condition and again told nursing she is not able to make comfort care decision.Pt with 3 hypoglycemic events over night.  VITAL SIGNS: BP 105/71   Pulse (!) 103   Temp 98.1 F (36.7 C) (Bladder)   Resp (!) 35   Ht 5\' 10"  (1.778 m)   Wt 238 lb 1.6 oz (108 kg)   SpO2 100%   BMI 34.16 kg/m   HEMODYNAMICS:    VENTILATOR SETTINGS: Vent Mode: PRVC FiO2 (%):  [100 %] 100 % Set Rate:  [35 bmp] 35 bmp Vt Set:  [600 mL] 600 mL PEEP:  [8 cmH20] 8 cmH20 Plateau Pressure:  [26 cmH20-40 cmH20] 27 cmH20  INTAKE / OUTPUT: I/O last 3 completed shifts: In: 1613.9 [I.V.:1278.9; Other:35; NG/GT:100; IV  Piggyback:200] Out: 2245 [Urine:1445; Emesis/NG output:800]  PHYSICAL EXAMINATION: General: Appears older than stated age HEENT: Negative doll's eyes, pupils fixed PSY: Not available Neuro: Other than having a respiratory drive negative.  No gag reflex, no corneal reflex. CV: Heart sounds are distant PULM: Coarse rhonchi bilaterally GI: Soft Extremities: warm/dry, plus edema, right index finger has been surgically removed in the past Skin: no rashes or lesions    LABS:  BMET Recent Labs  Lab  0344 05/10/17 0330 05/11/17 0404  NA 147* 143 147*  K 4.6 3.9 4.9  CL 111 107 108  CO2 19* 22 27  BUN 33* 43* 65*  CREATININE 2.82* 2.18* 3.03*  GLUCOSE 113* 209* 93    Electrolytes Recent Labs  Lab  0344 05/10/17 0330 05/11/17 0404  CALCIUM 7.3* 7.5* 7.0*  MG 1.6* 2.0 2.6*  PHOS 2.3* 3.1 6.7*    CBC Recent Labs  Lab 05/22/2017 1602  0344 05/10/17 0330  WBC 13.0* 29.0* 27.7*  HGB 15.8 20.1* 19.2*  HCT 47.8 56.4* 53.5*  PLT 199 210 165    Coag's Recent Labs  Lab 05/02/2017 1602  APTT 52*  INR 1.20    Sepsis Markers Recent Labs  Lab 05/16/2017 1629 05/14/2017 1930 05/13/2017 2030 05/13/2017 2230  0344 05/10/17 0330  LATICACIDVEN 13.97* 5.8*  --  5.7*  --   --  PROCALCITON  --   --  4.48  --  109.94 64.89    ABG Recent Labs  Lab  0350  0846 05/10/17 0325  PHART 7.341* 7.359 7.440  PCO2ART 42.2 35.0 38.3  PO2ART 290* 75.0* 80.2*    Liver Enzymes Recent Labs  Lab 04/28/2017 1602  AST 74*  ALT 46  ALKPHOS 71  BILITOT 0.6  ALBUMIN 3.3*    Cardiac Enzymes Recent Labs  Lab 05/02/2017 2147  0344  TROPONINI 0.90* 3.93*    Glucose Recent Labs  Lab 05/11/17 0203 05/11/17 0251 05/11/17 0413 05/11/17 0501 05/11/17 0658 05/11/17 0804  GLUCAP 115* 77 89 124* 158* 184*    Imaging Dg Chest Port 1 View  Result Date: 05/11/2017 CLINICAL DATA:  Respiratory failure EXAM: PORTABLE CHEST 1  VIEW COMPARISON:  05/10/2017 FINDINGS: Endotracheal tube in good position.  NG in the stomach. Progression of bibasilar airspace disease. Small pleural effusions bilaterally. Negative for edema. IMPRESSION: Endotracheal tube in good position. Progression of bibasilar atelectasis/infiltrate. Progression of small bilateral effusions. Electronically Signed   By: Marlan Palau M.D.   On: 05/11/2017 07:01     STUDIES:  CT head 2/13 > subtle loss of cerebral sulci and gray white matter suggesting early diffuse cerebral edema likely due to anoxia Echo 2/13 >  EEG 2/13 >   CULTURES: BC x 2 2/13 > Urine 2/13 >  Sputum 2/13 >   ANTIBIOTICS: Vanc 2/13 > Zosyn 2/13 >  SIGNIFICANT EVENTS: 2/13 > admit with asystolic arrest, UDS positive for opiates and THC  LINES/TUBES: ETT 2/13 >  DISCUSSION: 52yo male with hx polysubstance abuse with asystolic arrest with at least 30 mins downtime prior to ROSC. Did not respond to narcan in the field.  UDS positive for opiates and THC.  ASSESSMENT / PLAN:  PULMONARY Acute respiratory failure - due to inability to protect the airway in setting cardiac arrest. Hx OSA, COPD per report, tobacco dependence. 2/16>> CXR: Progression of bibasilar atelectasis/infiltrate. Progression of small bilateral effusions.  P:   Continue Full vent support>> no plans to wean 2/2 anoxic injury ABG. prn Bronchial hygiene. Follow CXR.  CARDIOVASCULAR Asystolic arrest - unclear etiology at this point.   ? Drug abuse.  UDS pending.>> + for opiates and cannabus Hypertensive urgency.>> resolved Troponin bump - due to above. Requiring more neo, increased to 200 mcg 2/16 EF 30-35%, LVEF  with global hypokinesis and anterior akinesis, moderate   LVH, grade 1 DD, indeterminate LV filling pressure.  P:  Poor candidate for hypothermia protocol (also hypothermic on arrival). Cardiology consulted, no interventions planned. Echo as above  Labetalol PRN / Hydralazine  PRN. Pressors to maintain MAP > 65 Tele  RENAL Lab Results  Component Value Date   CREATININE 3.03 (H) 05/11/2017   CREATININE 2.18 (H) 05/10/2017   CREATININE 2.82 (H)    Recent Labs  Lab  0344 05/10/17 0330 05/11/17 0404  K 4.6 3.9 4.9   Recent Labs  Lab  0344 05/10/17 0330 05/11/17 0404  NA 147* 143 147*    AKI. Lactic acidosis - presumed due to prolonged downtime. ? sepsis Hypokalemia - resolved. Hypophosphatemia.>> resolved Hypomagnesemia.>> resolved P:   NS @ 100. Follow BMP. Follow output Replete electrolytes as needed  GASTROINTESTINAL GI prophylaxis. P:   NPO. PPI .  HEMATOLOGIC Recent Labs     0344 05/10/17 0330  HGB 20.1* 19.2*    A:   Hemoconcentration. VTE prophylaxis. P:  SCD's / heparin. Trend CBC Transfuse for HGB <  7  INFECTIOUS A:   ? Occult infection - no clear source at this time; however, PCT significantly elevated at 109, WBC 29, overnight spiked fever. 1/2 BC  GS + for Gram+ Cocci Progression of bibasilar atelectasis/infiltrate. Progression of small bilateral effusions. P:   Continue empiric abx for now (vanc / zosyn). Follow cultures. Follow PCT 64.89 on 05/10/2017  ENDOCRINE CBG (last 3)  Recent Labs    05/11/17 0501 05/11/17 0658 05/11/17 0804  GLUCAP 124* 158* 184*    A:   Hx DM.   Hypoglycemic events  overnight despite D10 infusion P:   Continue D10@ 100 CMET in am  D50 as needed per CBG's  NEUROLOGIC AMS - post asystolic arrest - prolonged downtime, likely anoxic injury. Hx substance abuse - UDS positive for opiates and THC. P:   Sedation:  Propofol gtt / fentanyl gtt / Midazolam PRN (currently not requiring any sedation). >>> EEG: EEG is markedly abnormal due to diffuse background suppression and lack of EEG reactivity with noxious stimulation. RASS goal: 0 to -1. Pupils are blown bilaterally Assess EEG to help with prognostication, suspect severe  anoxia. He has severe brain dysfunction and has little chance of any meaningful recovery.  FAMILY  - Updates: 05/11/2017 no family at bedside. 05/11/2017 sister  called  Nursing for update 2/16 am. She continues to have concerns about comfort care  there is a note from 2/14  That states  she was ready for DNR status but  she does not want comfort care is not ready for withdrawal.  We will continue with current care at this time with no escalation of care. She understands that the patient will not survive this hospitalization.Will continue ongoing discussions regarding withdrawal and comfort care. He remains a DNR.  - Inter-disciplinary family meet or Palliative Care meeting due by:  05/15/17.  CCAPP  time: 30 min.   Bevelyn NgoSarah F. Maliik Karner, AGACNP-BC [facility] Pager:  [phone] 05/11/2017, 10:03 AM

## 2017-05-11 NOTE — Progress Notes (Signed)
eLink Physician-Brief Progress Note Patient Name: Jim Peterson DOB: 02-Jan-1966 MRN: [id number]   Date of Service  05/11/2017  HPI/Events of Note  Hypoglycemia x2  eICU Interventions  Start d10-0.45 saline.        Bernyce Brimley 05/11/2017, 12:16 AM

## 2017-05-12 ENCOUNTER — Inpatient Hospital Stay (HOSPITAL_COMMUNITY): Payer: Medicare Other

## 2017-05-12 LAB — GLUCOSE, CAPILLARY
GLUCOSE-CAPILLARY: 194 mg/dL — AB (ref 65–99)
Glucose-Capillary: 168 mg/dL — ABNORMAL HIGH (ref 65–99)
Glucose-Capillary: 170 mg/dL — ABNORMAL HIGH (ref 65–99)
Glucose-Capillary: 173 mg/dL — ABNORMAL HIGH (ref 65–99)

## 2017-05-12 LAB — BLOOD GAS, ARTERIAL
Acid-Base Excess: 2.3 mmol/L — ABNORMAL HIGH (ref 0.0–2.0)
Bicarbonate: 25 mmol/L (ref 20.0–28.0)
DRAWN BY: 252031
FIO2: 100
MECHVT: 600 mL
O2 Saturation: 99.4 %
PEEP/CPAP: 8 cmH2O
PH ART: 7.535 — AB (ref 7.350–7.450)
PO2 ART: 174 mmHg — AB (ref 83.0–108.0)
Patient temperature: 98
RATE: 35 resp/min
pCO2 arterial: 29.6 mmHg — ABNORMAL LOW (ref 32.0–48.0)

## 2017-05-12 LAB — COMPREHENSIVE METABOLIC PANEL
ALT: 69 U/L — AB (ref 17–63)
AST: 121 U/L — AB (ref 15–41)
Albumin: 2.5 g/dL — ABNORMAL LOW (ref 3.5–5.0)
Alkaline Phosphatase: 60 U/L (ref 38–126)
Anion gap: 12 (ref 5–15)
BUN: 52 mg/dL — ABNORMAL HIGH (ref 6–20)
CHLORIDE: 115 mmol/L — AB (ref 101–111)
CO2: 23 mmol/L (ref 22–32)
CREATININE: 2.6 mg/dL — AB (ref 0.61–1.24)
Calcium: 7.6 mg/dL — ABNORMAL LOW (ref 8.9–10.3)
GFR, EST AFRICAN AMERICAN: 31 mL/min — AB (ref >60)
GFR, EST NON AFRICAN AMERICAN: 27 mL/min — AB (ref >60)
Glucose, Bld: 205 mg/dL — ABNORMAL HIGH (ref 65–99)
POTASSIUM: 3.3 mmol/L — AB (ref 3.5–5.1)
Sodium: 150 mmol/L — ABNORMAL HIGH (ref 135–145)
Total Bilirubin: 1.9 mg/dL — ABNORMAL HIGH (ref 0.3–1.2)
Total Protein: 5.5 g/dL — ABNORMAL LOW (ref 6.5–8.1)

## 2017-05-12 LAB — CBC
HCT: 45.3 % (ref 39.0–52.0)
Hemoglobin: 15.3 g/dL (ref 13.0–17.0)
MCH: 33.9 pg (ref 26.0–34.0)
MCHC: 33.8 g/dL (ref 30.0–36.0)
MCV: 100.4 fL — AB (ref 78.0–100.0)
PLATELETS: 131 10*3/uL — AB (ref 150–400)
RBC: 4.51 MIL/uL (ref 4.22–5.81)
RDW: 14.6 % (ref 11.5–15.5)
WBC: 16.6 10*3/uL — ABNORMAL HIGH (ref 4.0–10.5)

## 2017-05-12 LAB — CULTURE, BLOOD (ROUTINE X 2): Special Requests: ADEQUATE

## 2017-05-12 LAB — PROCALCITONIN: PROCALCITONIN: 22.62 ng/mL

## 2017-05-12 MED ORDER — LORAZEPAM 1 MG PO TABS: 1.0000 mg | ORAL_TABLET | ORAL | Status: DC | PRN

## 2017-05-12 MED ORDER — ACETAMINOPHEN 650 MG RE SUPP: 650.0000 mg | Freq: Four times a day (QID) | RECTAL | Status: DC | PRN

## 2017-05-12 MED ORDER — LORAZEPAM 2 MG/ML IJ SOLN: 1.0000 mg | INTRAMUSCULAR | Status: DC | PRN

## 2017-05-12 MED ORDER — POLYVINYL ALCOHOL 1.4 % OP SOLN
1.0000 [drp] | Freq: Four times a day (QID) | OPHTHALMIC | Status: DC | PRN
  Filled 2017-05-12: qty 15

## 2017-05-12 MED ORDER — HALOPERIDOL 0.5 MG PO TABS: 0.5000 mg | ORAL_TABLET | ORAL | Status: DC | PRN

## 2017-05-12 MED ORDER — SODIUM CHLORIDE 0.45 % IV SOLN
INTRAVENOUS | Status: DC
  Administered 2017-05-12: 15:00:00 via INTRAVENOUS

## 2017-05-12 MED ORDER — HALOPERIDOL LACTATE 2 MG/ML PO CONC
0.5000 mg | ORAL | Status: DC | PRN
  Filled 2017-05-12: qty 0.3

## 2017-05-12 MED ORDER — LORAZEPAM 2 MG/ML PO CONC: 1.0000 mg | ORAL | Status: DC | PRN

## 2017-05-12 MED ORDER — VANCOMYCIN HCL IN DEXTROSE 1-5 GM/200ML-% IV SOLN
1000.0000 mg | INTRAVENOUS | Status: DC
  Filled 2017-05-12: qty 200

## 2017-05-12 MED ORDER — HALOPERIDOL LACTATE 5 MG/ML IJ SOLN: 0.5000 mg | INTRAMUSCULAR | Status: DC | PRN

## 2017-05-12 MED ORDER — ONDANSETRON 4 MG PO TBDP: 4.0000 mg | ORAL_TABLET | Freq: Four times a day (QID) | ORAL | Status: DC | PRN

## 2017-05-12 MED ORDER — FENTANYL CITRATE (PF) 100 MCG/2ML IJ SOLN: 25.0000 ug | INTRAMUSCULAR | Status: DC | PRN

## 2017-05-12 MED ORDER — POTASSIUM CHLORIDE 20 MEQ/15ML (10%) PO SOLN
40.0000 meq | Freq: Once | ORAL | Status: AC
  Administered 2017-05-12: 40 meq via ORAL
  Filled 2017-05-12: qty 30

## 2017-05-12 MED ORDER — BIOTENE DRY MOUTH MT LIQD: 15.0000 mL | OROMUCOSAL | Status: DC | PRN

## 2017-05-12 MED ORDER — POTASSIUM CHLORIDE 10 MEQ/100ML IV SOLN
10.0000 meq | INTRAVENOUS | Status: AC
  Administered 2017-05-12 (×3): 10 meq via INTRAVENOUS
  Filled 2017-05-12 (×3): qty 100

## 2017-05-12 MED ORDER — ONDANSETRON HCL 4 MG/2ML IJ SOLN: 4.0000 mg | Freq: Four times a day (QID) | INTRAMUSCULAR | Status: DC | PRN

## 2017-05-12 MED ORDER — GLYCOPYRROLATE 1 MG PO TABS
1.0000 mg | ORAL_TABLET | ORAL | Status: DC | PRN
  Filled 2017-05-12: qty 1

## 2017-05-12 MED ORDER — ACETAMINOPHEN 325 MG PO TABS: 650.0000 mg | ORAL_TABLET | Freq: Four times a day (QID) | ORAL | Status: DC | PRN

## 2017-05-12 MED ORDER — FREE WATER
100.0000 mL | Freq: Three times a day (TID) | Status: DC
  Administered 2017-05-12: 100 mL

## 2017-05-12 MED ORDER — GLYCOPYRROLATE 0.2 MG/ML IJ SOLN
0.2000 mg | INTRAMUSCULAR | Status: DC | PRN
Start: 2017-05-12 — End: 2017-05-13

## 2017-05-13 LAB — CULTURE, BLOOD (ROUTINE X 2)
Culture: NO GROWTH
SPECIAL REQUESTS: ADEQUATE

## 2017-05-13 LAB — GLUCOSE, CAPILLARY: Glucose-Capillary: 40 mg/dL — CL (ref 65–99)

## 2017-05-14 ENCOUNTER — Encounter (HOSPITAL_COMMUNITY): Payer: Self-pay | Admitting: *Deleted

## 2017-05-24 NOTE — Progress Notes (Signed)
eLink Physician-Brief Progress Note Patient Name: Jim BoardRoger Peterson DOB: 23-May-1965 MRN: [id number]   Date of Service  04/17/17  HPI/Events of Note  K+ = 3.3 and Creatinine = 2.60.  eICU Interventions  Will replace K+.     Intervention Category Major Interventions: Electrolyte abnormality - evaluation and management  Sommer,Steven Eugene 04/17/17, 6:23 AM

## 2017-05-24 NOTE — Progress Notes (Signed)
Spoken to both CCM and charge RN, pt does not qualify for ME case.

## 2017-05-24 NOTE — Progress Notes (Signed)
   05/17/2017 0900  Clinical Encounter Type  Visited With Health care provider  Visit Type Patient actively dying;Follow-up   [facility].  Familiar with this patient as I visited with the friends of the patient and via Skype the patients sister.  Nurse indicated the sister who is next of kin is struggling with transitioning to comfort care.  She keeps saying one more day.  I offered to call the sister if the staff feels that will help as the sister is not able to travel from [place] to be here.  Will follow up later today. Chaplain Agustin CreeNewton Pearson Picou

## 2017-05-24 NOTE — Progress Notes (Signed)
At 1823, patient's sister Stanton KidneyDebra called. Discussed with her and gave update on patient. She has decided and confirmed that she wants the go-ahead to transition patient to comfort care. CCM paged to give update. Will continue to monitor

## 2017-05-24 NOTE — Progress Notes (Signed)
IV morphine gtt wasted in sink with Tawnya CrookGretchen Smitherman, RN. 240ml WIS.

## 2017-05-24 NOTE — Final Progress Note (Signed)
The patient's family has committed to terminal wean and comfort care. I have placed end of life order set for patient.

## 2017-05-24 NOTE — Procedures (Signed)
Extubation Procedure Note  Patient Details:   Name: Jim Peterson DOB: Jun 29, 1965 MRN: [id number]   Airway Documentation:     Evaluation  O2 sats: currently acceptable Complications: No apparent complications Patient did tolerate procedure well. Bilateral Breath Sounds: Clear   No   Pt terminally extubated per MD order.  Roanna RaiderWoodall, [name] 05/03/2017, 8:47 PM

## 2017-05-24 NOTE — Progress Notes (Signed)
PCCM Interval Note  Sister Stanton KidneyDebra is ready to proceed with extubation for comfort measures. RT and Bedside RN notified. Orders updated.   Jovita KussmaulKatalina Fredick Schlosser, AGACNP-BC [facility]  Pgr: [phone]  PCCM Pgr: [phone]

## 2017-05-24 NOTE — Progress Notes (Signed)
Patient asystolic on monitor and a-line non-pulsatile. This RN and Charolotte Capuchiniffany Nichols RN listened for breath and heart sounds for >412min, none auscultated. No family or visitors present at bedside, so this RN was present with patient during the last few minutes of his life and during his passing. Time of death 2110.

## 2017-05-24 NOTE — Progress Notes (Signed)
I received a request to call the patients sister Stanton KidneyDebra. She feels she has come to terms with Comfort care. She does wish to allow the patient's friends Lupita LeashDonna and Autumn one last visit before she initiates comfort measures. She has spoken to them and they have told her they are coming to visit today. She does not want to let these friends know of the comfort care decision.She is planning on calling the untit once the friends have visited to give the go ahead for comfort care. She is tearful and requests that he is kept comfortable and does not die alone. I assured her the staff here will make sure he is not alone. We discussed that each patient is different in regard to how long they survive after comfort measures have been implemented.She verbalized understanding. She will call the unit when ready to initiate comfort care, most likely this will be tonight. 2/17  Bevelyn NgoSarah F. Groce, AGACNP-BC [facility] Pager # [phone] 05/19/2017  1:40 PM

## 2017-05-24 NOTE — Progress Notes (Signed)
 PULMONARY / CRITICAL CARE MEDICINE   Name: Jim Peterson MRN: [id number] DOB: 09/12/1965    ADMISSION DATE:  05/11/2017 CONSULTATION DATE:  2/13  REFERRING MD:  EDP  CHIEF COMPLAINT:  Post arrest  HISTORY OF PRESENT ILLNESS:   52yo male with PMH of tobacco dependence, COPD per report, OSA, DM, depression, substance abuse.  He presented 2/13 after being found unresponsive at a friends house.  He was seen about 15 mins prior to being found.  Upon being found, friend gave him 3 doses of narcan (has hx of substance abuse but reportedly has been clean for 2 - 3 months at least).  After no change with Narcan, EMS was called and upon their arrival, pt found in asystole.  ACLS protocol was initiated and performed for another 15 - 20 minutes prior to ROSC.  He was intubated in the field and brought to ED where he remained unresponsive.  pH was noted to be 6.87, lactic acid 13.97, WBC 13, K 3.4, SCr 2.13. UDS positive for opiates and THC.  Cardiology called in consultation and pt deemed to not be a candidate for any intervention.  PCCM called for admission.  He will not be started on hypothermia protocol given prolonged downtime.  SUBJECTIVE: Severe anoxic injury is noted.  His sister,s had a change of heart concerning comfort care is not ready for that at this time.She is aware of condition and again told nursing she is not able to make comfort care decision.No hypoglycemic events overnight. Desaturates with attempts to wean FIO2. VITAL SIGNS: BP (!) 94/56   Pulse 86   Temp 97.7 F (36.5 C) (Bladder)   Resp (!) 35   Ht 5\' 10"  (1.778 m)   Wt 220 lb 3.8 oz (99.9 kg)   SpO2 94%   BMI 31.60 kg/m   HEMODYNAMICS:    VENTILATOR SETTINGS: Vent Mode: PRVC FiO2 (%):  [70 %-100 %] 100 % Set Rate:  [35 bmp] 35 bmp Vt Set:  [600 mL] 600 mL PEEP:  [8 cmH20] 8 cmH20 Plateau Pressure:  [25 cmH20-27 cmH20] 26 cmH20  INTAKE / OUTPUT: I/O last 3 completed shifts: In: 3275.6 [I.V.:2490.6;  Other:35; NG/GT:100; IV Piggyback:650] Out: 3780 [Urine:3780]  PHYSICAL EXAMINATION: General: Appears older than stated age, intubated and sedated HEENT: Negative doll's eyes, pupils fixed, 5 mm PSY: Unable Neuro: Other than having a respiratory drive negative.  No gag reflex, no corneal reflex. CV: Heart sounds are distant, S1, S2, No RMG PULM: Coarse rhonchi bilaterally, diminished per bases  GI: Soft, obese, Non-distended Extremities: warm/dry, plus edema, right index finger has been surgically removed in the past Skin: no rashes or lesions, warm , dry and intact    LABS:  BMET Recent Labs  Lab 05/10/17 0330 05/11/17 0404 2017/05/28 0314  NA 143 147* 150*  K 3.9 4.9 3.3*  CL 107 108 115*  CO2 22 27 23   BUN 43* 65* 52*  CREATININE 2.18* 3.03* 2.60*  GLUCOSE 209* 93 205*    Electrolytes Recent Labs  Lab  0344 05/10/17 0330 05/11/17 0404 05/28/17 0314  CALCIUM 7.3* 7.5* 7.0* 7.6*  MG 1.6* 2.0 2.6*  --   PHOS 2.3* 3.1 6.7*  --     CBC Recent Labs  Lab  0344 05/10/17 0330 2017-05-28 0314  WBC 29.0* 27.7* 16.6*  HGB 20.1* 19.2* 15.3  HCT 56.4* 53.5* 45.3  PLT 210 165 131*    Coag's Recent Labs  Lab 05/07/2017 1602  APTT 52*  INR 1.20  Sepsis Markers Recent Labs  Lab 05-25-2017 1629 2017/05/25 1930  May 25, 2017 2230  0344 05/10/17 0330 04/30/2017 0314  LATICACIDVEN 13.97* 5.8*  --  5.7*  --   --   --   PROCALCITON  --   --    < >  --  109.94 64.89 22.62   < > = values in this interval not displayed.    ABG Recent Labs  Lab  0846 05/10/17 0325 04/27/2017 0320  PHART 7.359 7.440 7.535*  PCO2ART 35.0 38.3 29.6*  PO2ART 75.0* 80.2* 174*    Liver Enzymes Recent Labs  Lab 25-May-2017 1602 05/04/2017 0314  AST 74* 121*  ALT 46 69*  ALKPHOS 71 60  BILITOT 0.6 1.9*  ALBUMIN 3.3* 2.5*    Cardiac Enzymes Recent Labs  Lab 05/25/2017 2147  0344  TROPONINI 0.90* 3.93*    Glucose Recent Labs  Lab  05/11/17 1240 05/11/17 1548 05/11/17 1934 05/11/17 2332 05/14/2017 0335 04/27/2017 0807  GLUCAP 285* 263* 209* 186* 194* 168*    Imaging Dg Chest Port 1 View  Result Date: 04/29/2017 CLINICAL DATA:  Respiratory failure. EXAM: PORTABLE CHEST 1 VIEW COMPARISON:  05/11/2017 FINDINGS: Endotracheal tube has tip 5.3 cm above the carina. Enteric tube courses into the region of the stomach and off the inferior portion of the film. Lungs are adequately inflated demonstrate persistent opacification over the right base likely atelectasis and small amount of pleural fluid although infection is possible. Mild left retrocardiac opacification with improved aeration in the left base. Cardiomediastinal silhouette and remainder the exam is unchanged. IMPRESSION: Improved aeration left base likely mild residual atelectasis. Persistent right base opacification likely atelectasis and small amount of right pleural fluid, although infection is possible. Tubes and lines as described. Electronically Signed   By: Elberta Fortis M.D.   On: 05/17/2017 07:34     STUDIES:  CT head 2/13 > subtle loss of cerebral sulci and gray white matter suggesting early diffuse cerebral edema likely due to anoxia Echo 2/13 >  EEG 2/13 >   CULTURES: BC x 2 2/13 >>  GS:  Gram + Cocci>> Urine 2/13 >  Sputum 2/13 >   ANTIBIOTICS: Vanc 2/13 > Zosyn 2/13 >  SIGNIFICANT EVENTS: 2/13 > admit with asystolic arrest, UDS positive for opiates and THC  LINES/TUBES: ETT 2/13 >  DISCUSSION: 52yo male with hx polysubstance abuse with asystolic arrest with at least 30 mins downtime prior to ROSC. Did not respond to narcan in the field.  UDS positive for opiates and THC.  ASSESSMENT / PLAN:  PULMONARY Acute respiratory failure - due to inability to protect the airway in setting cardiac arrest. Hx OSA, COPD per report, tobacco dependence. 2/17 >> BJY:[id number] aeration left base likely mild residual atelectasis. Persistent right base  opacification likely atelectasis and small amount of right pleural fluid, although infection is possible.  Desaturates with attempts to wean FIO2.  P:   Continue Full vent support>> no plans to wean 2/2 anoxic injury ABG. prn Bronchial hygiene. Follow CXR. Follow Micro  CARDIOVASCULAR Asystolic arrest - unclear etiology at this point.   ? Drug abuse.  UDS pending.>> + for opiates and cannabus Hypertensive urgency.>> resolved Troponin bump - due to above. Requiring neo, weaned to 90 mcg from 200 mcg  2/16. EF 30-35%, LVEF  with global hypokinesis and anterior akinesis, moderate   LVH, grade 1 DD, indeterminate LV filling pressure.  P:  Poor candidate for hypothermia protocol (also hypothermic on arrival). Cardiology consulted, no interventions planned.  Echo as above  Labetalol PRN / Hydralazine PRN. Remains on Pressors to maintain MAP > 65, but has weaned to 90 mcg MonitorTele  RENAL Lab Results  Component Value Date   CREATININE 2.60 (H) 04/29/2017   CREATININE 3.03 (H) 05/11/2017   CREATININE 2.18 (H) 05/10/2017   Recent Labs  Lab 05/10/17 0330 05/11/17 0404 05/20/2017 0314  K 3.9 4.9 3.3*   Recent Labs  Lab 05/10/17 0330 05/11/17 0404 05/06/2017 0314  NA 143 147* 150*    AKI. Lactic acidosis - presumed due to prolonged downtime. ? sepsis Hypokalemia - repleted Hypophosphatemia.>> resolved Hypomagnesemia.>> resolved Hypernatremia P:   0.45 NS @ 100 Follow BMP. Follow output Replete electrolytes as needed Add free water  GASTROINTESTINAL GI prophylaxis. OG tube P:   NPO. PPI .  HEMATOLOGIC Recent Labs    05/10/17 0330 05/18/2017 0314  HGB 19.2* 15.3    A:   Hemoconcentration. VTE prophylaxis. P:  SCD's / heparin. Trend CBC Transfuse for HGB < 7  INFECTIOUS A:   ? Occult infection - no clear source at this time; however, PCT significantly elevated at 109, WBC 29, overnight spiked fever. 1/2 BC  GS + for Gram+ Cocci Progression of  bibasilar atelectasis/infiltrate. Progression of small bilateral effusions. P:   Continue empiric abx for now (vanc / zosyn). Follow cultures. Follow PCT 64.89 on 05/10/2017>> Down Trending 22.62 2/17   ENDOCRINE CBG (last 3)  Recent Labs    05/11/17 2332 05/02/2017 0335 05/23/2017 0807  GLUCAP 186* 194* 168*    A:   Hx DM.   No further hypoglycemic events P:   Decrease D 10 to 75 CMET in am  D50 as needed per CBG's  NEUROLOGIC AMS - post asystolic arrest - prolonged downtime, likely anoxic injury. Hx substance abuse - UDS positive for opiates and THC. P:   Sedation:  Propofol gtt / fentanyl gtt / Midazolam PRN (currently not requiring any sedation). >>> EEG: EEG is markedly abnormal due to diffuse background suppression and lack of EEG reactivity with noxious stimulation. RASS goal: 0 to -1. Pupils are blown bilaterally Assess EEG to help with prognostication, suspect severe anoxia. He has severe brain dysfunction and has little chance of any meaningful recovery.  FAMILY  - Updates: 05/06/2017 no family at bedside. DNR established 2/16 05/02/2017 sister  Called the unit. She is ready for comfort care after friends visit. ( (See separate Note)  We will continue with current care at this time with no escalation of care, with plan to transition to comfort care after friends have had time to visit most likely 2/17 pm or 2/18 am. Sister would like to not wait longer than 2/18 am.   - Inter-disciplinary family meet or Palliative Care meeting due by:  05/15/17.   Bevelyn NgoSarah F. Groce, AGACNP-BC [facility] Pager:  [phone] 04/26/2017, 12:18 PM

## 2017-05-24 DEATH — deceased

## 2017-06-24 NOTE — Death Summary Note (Signed)
Marzetta BoardRoger Gerstel was a 52 y.o. male smoker admitted on January 24, 2018 was found unresponsive.  A friend trying giving him narcan.  He developed asystole.  He had ROSC after more than 30 minutes.  His UDS was positive for opiates and THC.  He was intubated.  Seen by cardiology.  Developed myoclonus.  Family updated.  Made DNR.  Transition to comfort measures.  He was extubated on 05/03/2017 and expired at 2110.  Cause of death: Accidental opiate overdose leading to asystolic cardiac arrest  Final diagnoses: Acute hypoxic and hypercapnic respiratory failure Anoxic encephalopathy COPD Tobacco abuse Obstructive sleep apnea Diabetes mellitus II Depression Metabolic acidosis with lactic acidosis Hypertensive urgency Hypokalemia Myoclonus Hypophosphatemia Hypomagnesemia Aspiration pneumonia  Coralyn HellingVineet Trevar Boehringer, MD [facility] 06/03/2017, 8:52 PM

## 2019-09-06 IMAGING — DX DG CHEST 1V PORT
1 series · 1 of 1 positions shown · non-contrast
Comparison: 05/11/2017

CLINICAL DATA: Respiratory failure.

EXAM:
PORTABLE CHEST 1 VIEW

[chest]
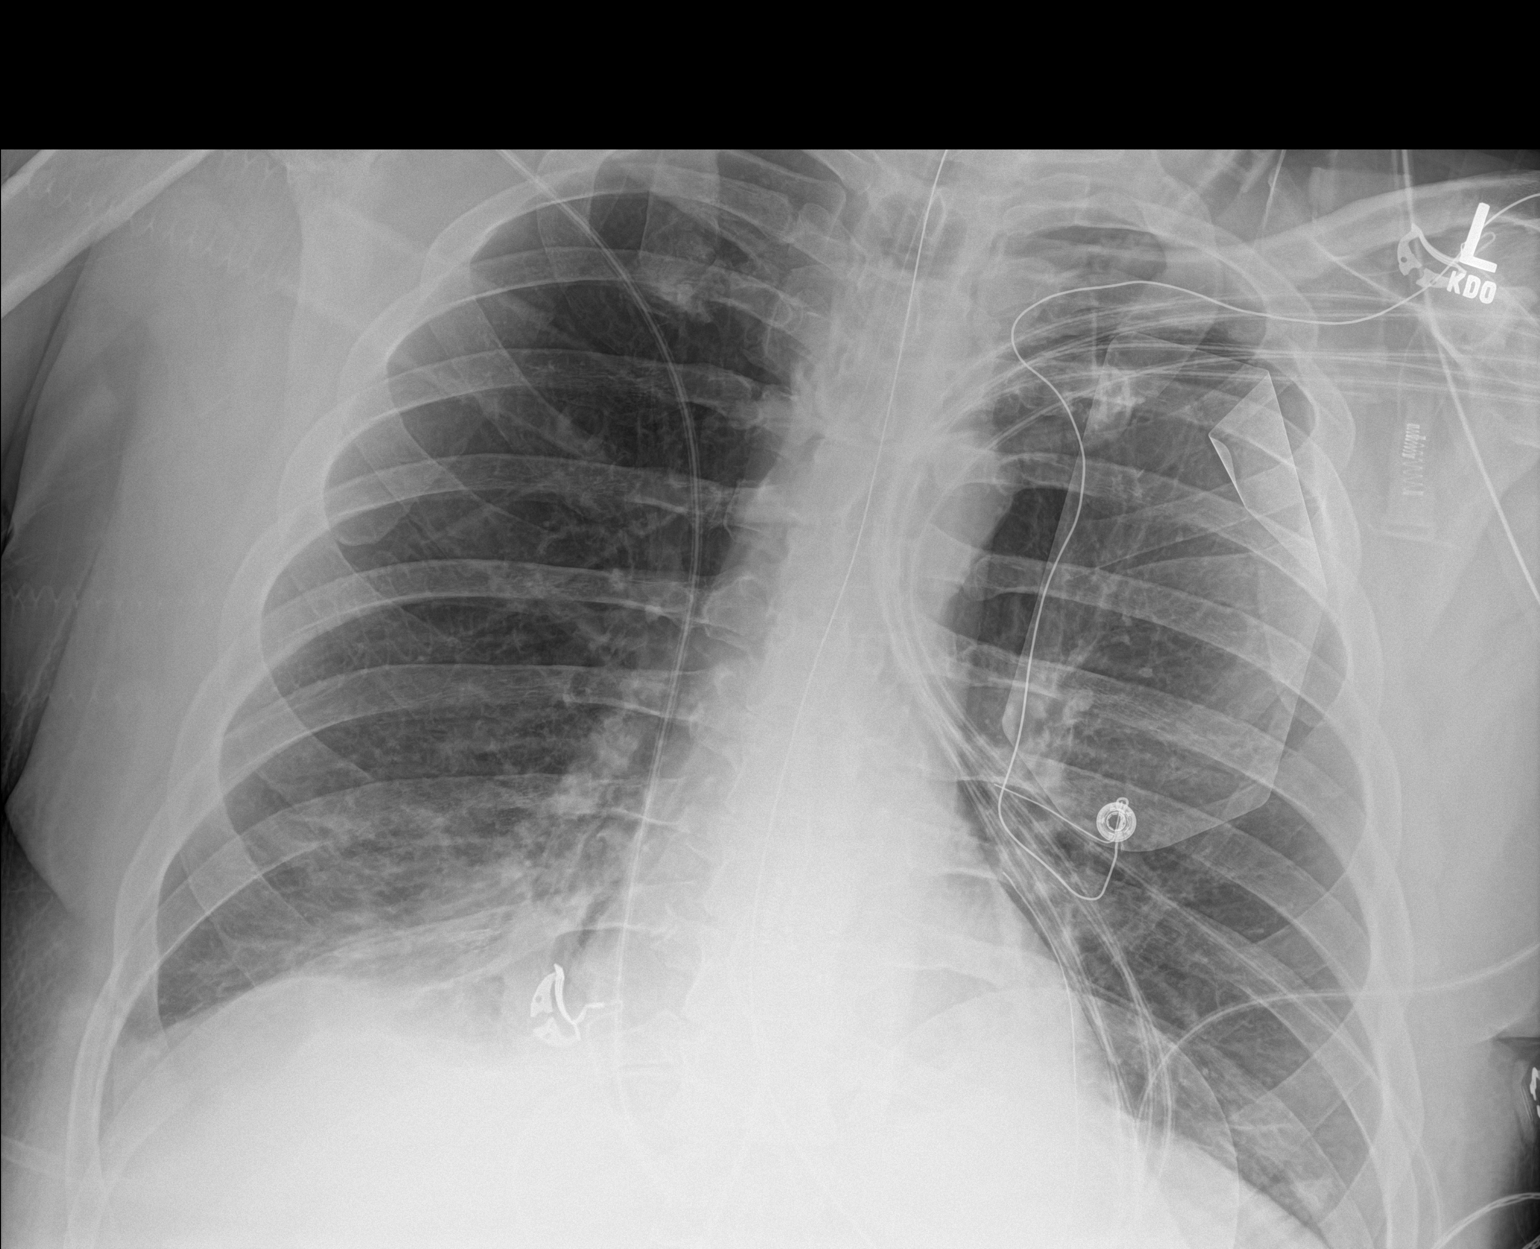

[1 of 1 positions shown; findings below may reference images not displayed]

FINDINGS: Endotracheal tube has tip 5.3 cm above the carina. Enteric tube
courses into the region of the stomach and off the inferior portion
of the film. Lungs are adequately inflated demonstrate persistent
opacification over the right base likely atelectasis and small
amount of pleural fluid although infection is possible. Mild left
retrocardiac opacification with improved aeration in the left base.
Cardiomediastinal silhouette and remainder the exam is unchanged.
IMPRESSION: Improved aeration left base likely mild residual atelectasis.
Persistent right base opacification likely atelectasis and small
amount of right pleural fluid, although infection is possible.

Tubes and lines as described.
